# Patient Record
Sex: Female | Born: 1955
Health system: Southern US, Community
[De-identification: ages and names within clinical notes are randomized; demographics above are authoritative.]

---

## 1999-07-23 ENCOUNTER — Encounter: Admission: RE | Admit: 1999-07-23 | Discharge: 1999-07-23 | Payer: Self-pay | Admitting: Emergency Medicine

## 1999-07-23 ENCOUNTER — Encounter: Payer: Self-pay | Admitting: Emergency Medicine

## 1999-12-09 ENCOUNTER — Encounter: Payer: Self-pay | Admitting: Emergency Medicine

## 1999-12-09 ENCOUNTER — Emergency Department (HOSPITAL_COMMUNITY): Admission: EM | Admit: 1999-12-09 | Discharge: 1999-12-09 | Payer: Self-pay | Admitting: Emergency Medicine

## 2001-12-06 ENCOUNTER — Encounter: Payer: Self-pay | Admitting: Emergency Medicine

## 2001-12-06 ENCOUNTER — Encounter: Admission: RE | Admit: 2001-12-06 | Discharge: 2001-12-06 | Payer: Self-pay | Admitting: Emergency Medicine

## 2001-12-08 ENCOUNTER — Encounter: Admission: RE | Admit: 2001-12-08 | Discharge: 2001-12-08 | Payer: Self-pay | Admitting: Emergency Medicine

## 2001-12-08 ENCOUNTER — Encounter: Payer: Self-pay | Admitting: Emergency Medicine

## 2002-06-07 ENCOUNTER — Encounter (INDEPENDENT_AMBULATORY_CARE_PROVIDER_SITE_OTHER): Payer: Self-pay

## 2002-06-08 ENCOUNTER — Inpatient Hospital Stay (HOSPITAL_COMMUNITY): Admission: RE | Admit: 2002-06-08 | Discharge: 2002-06-09 | Payer: Self-pay | Admitting: Obstetrics and Gynecology

## 2004-03-04 ENCOUNTER — Emergency Department (HOSPITAL_COMMUNITY): Admission: EM | Admit: 2004-03-04 | Discharge: 2004-03-04 | Payer: Self-pay | Admitting: Family Medicine

## 2004-03-20 ENCOUNTER — Encounter: Admission: RE | Admit: 2004-03-20 | Discharge: 2004-03-20 | Payer: Self-pay | Admitting: Family Medicine

## 2004-05-06 ENCOUNTER — Encounter: Admission: RE | Admit: 2004-05-06 | Discharge: 2004-05-06 | Payer: Self-pay | Admitting: Family Medicine

## 2008-05-01 ENCOUNTER — Encounter: Admission: RE | Admit: 2008-05-01 | Discharge: 2008-05-01 | Payer: Self-pay | Admitting: Family Medicine

## 2010-04-25 IMAGING — MG MM DIAGNOSTIC BILATERAL
3 series · 3 of 3 positions shown · non-contrast
Comparison: 03/20/2004

CLINICAL DATA: The patient's physician feels that a lump in the 6
o'clock position of the right breast has increased in size.  The
patient does not feel a definite lump.

DIGITAL DIAGNOSTIC  BILATERAL  MAMMOGRAM  WITH CAD AND RIGHT BREAST
ULTRASOUND:

[L CC]
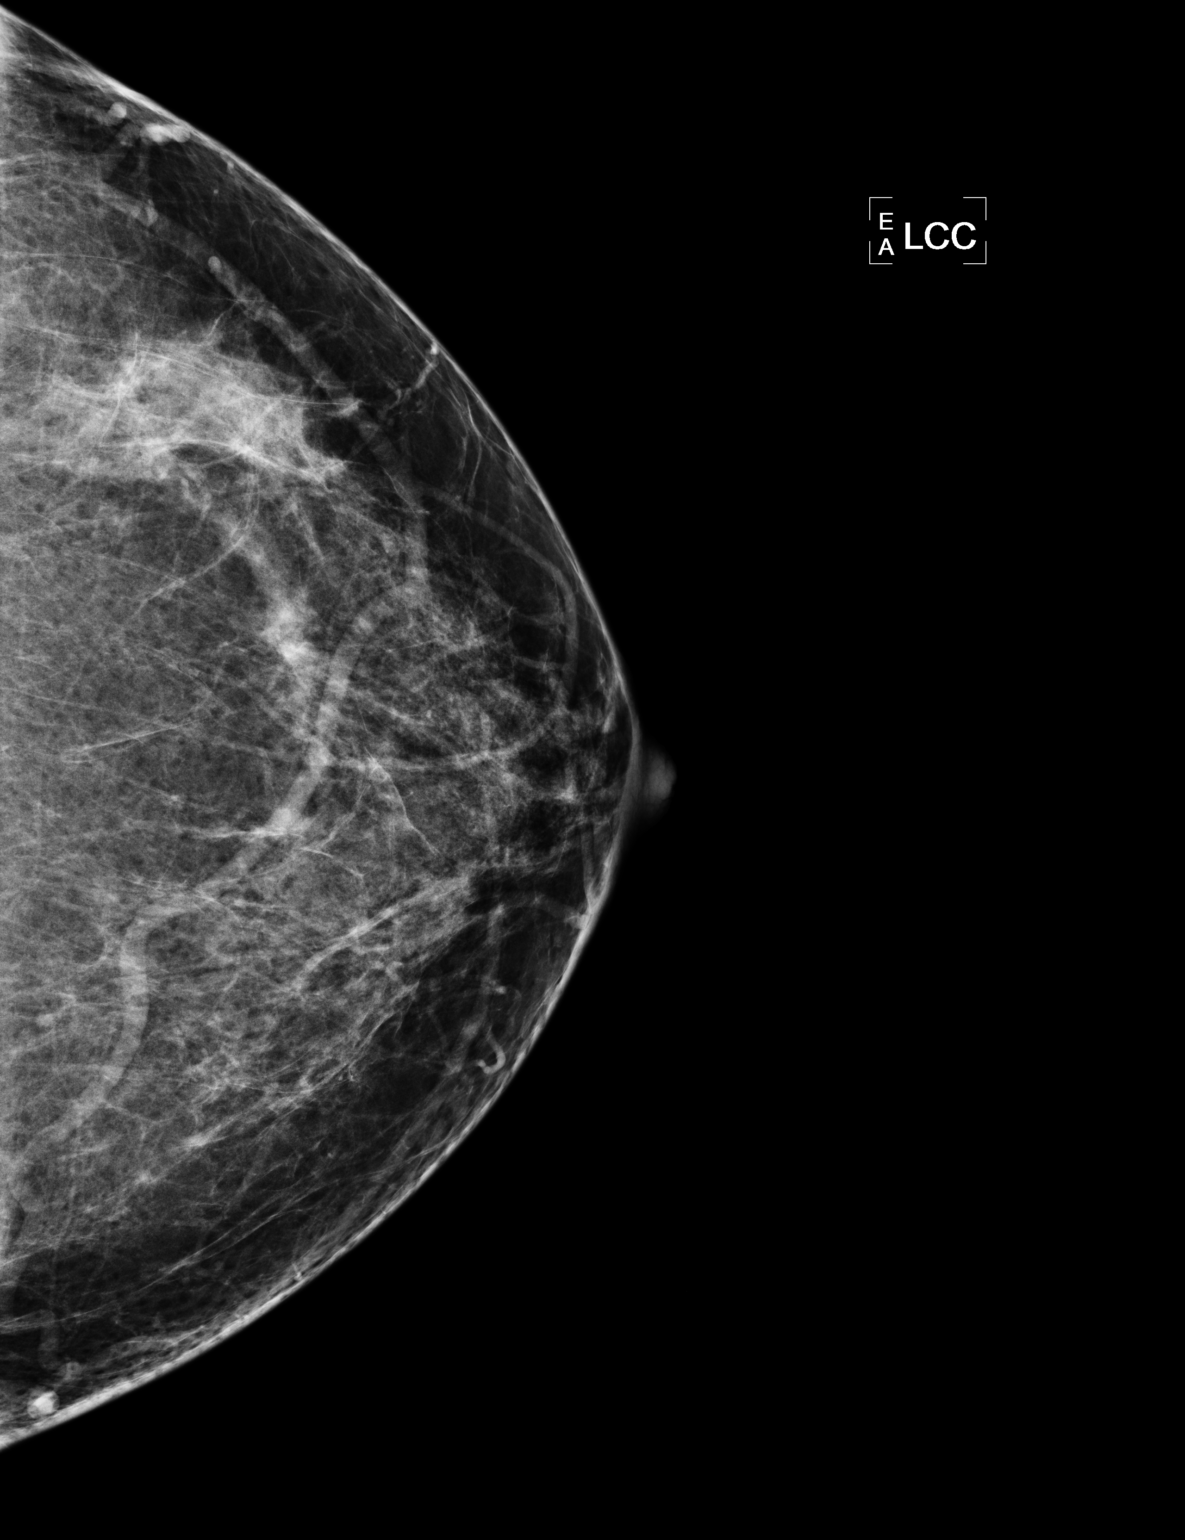

[L MLO]
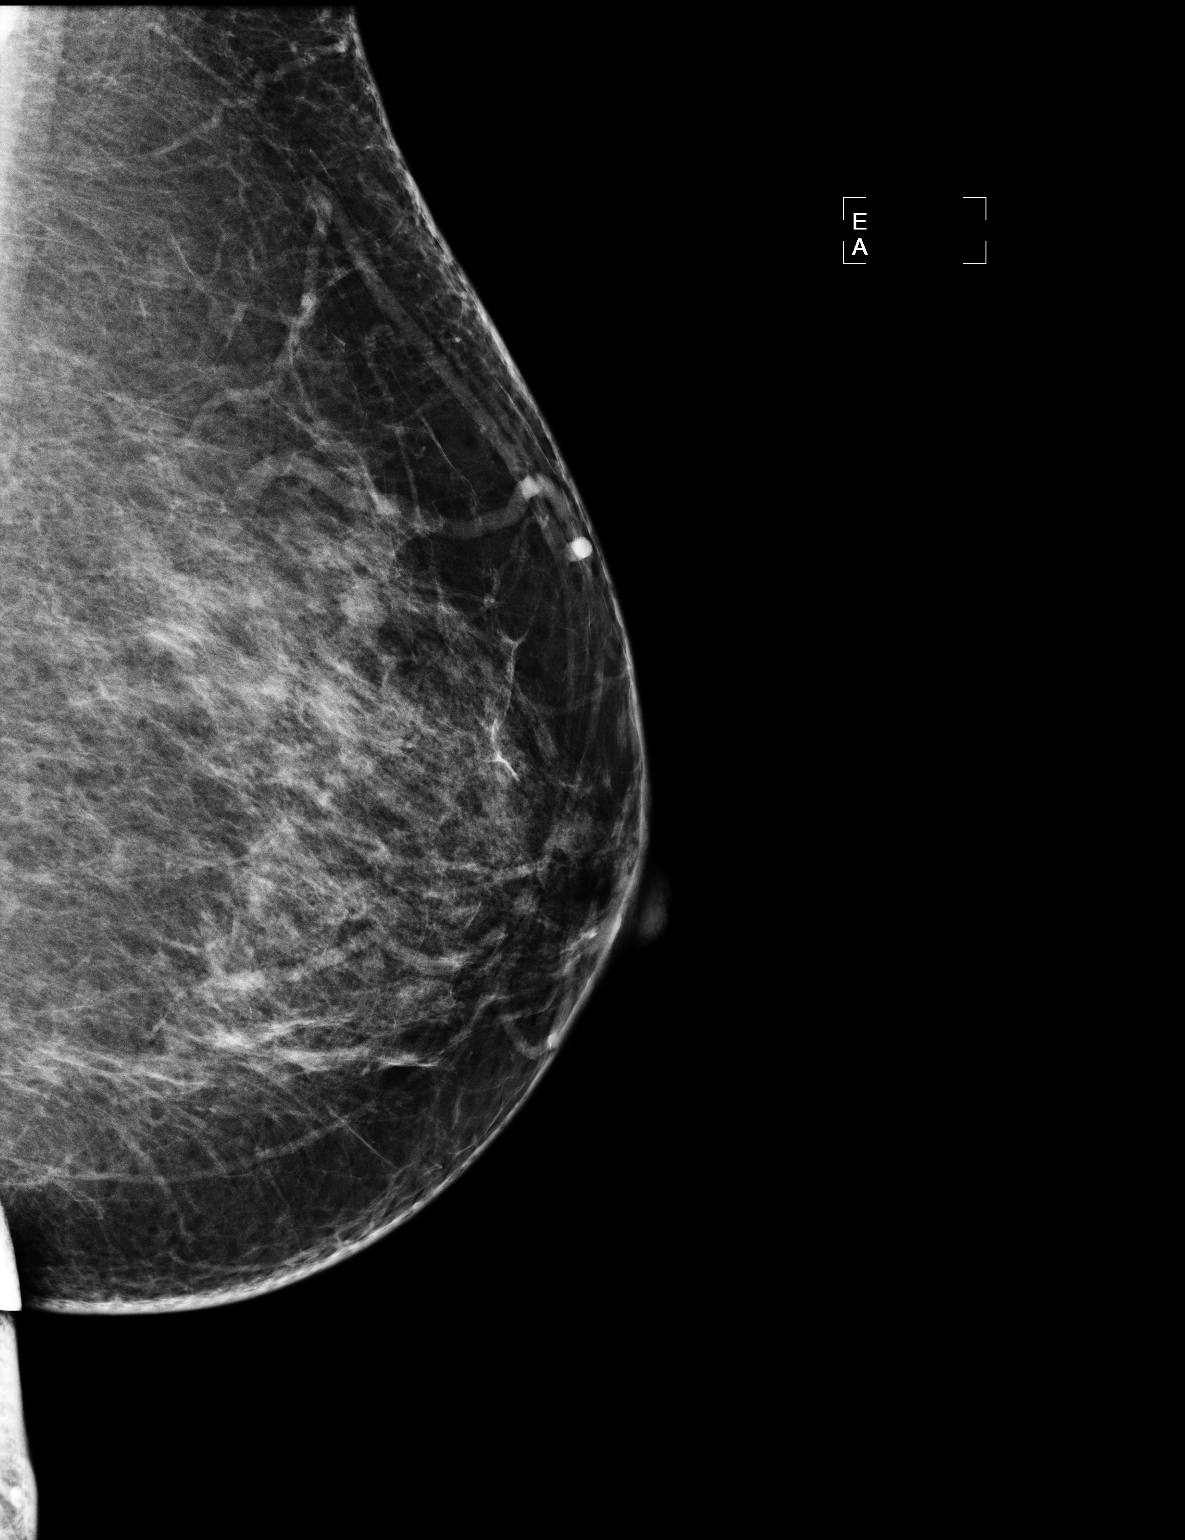

[R MLO]
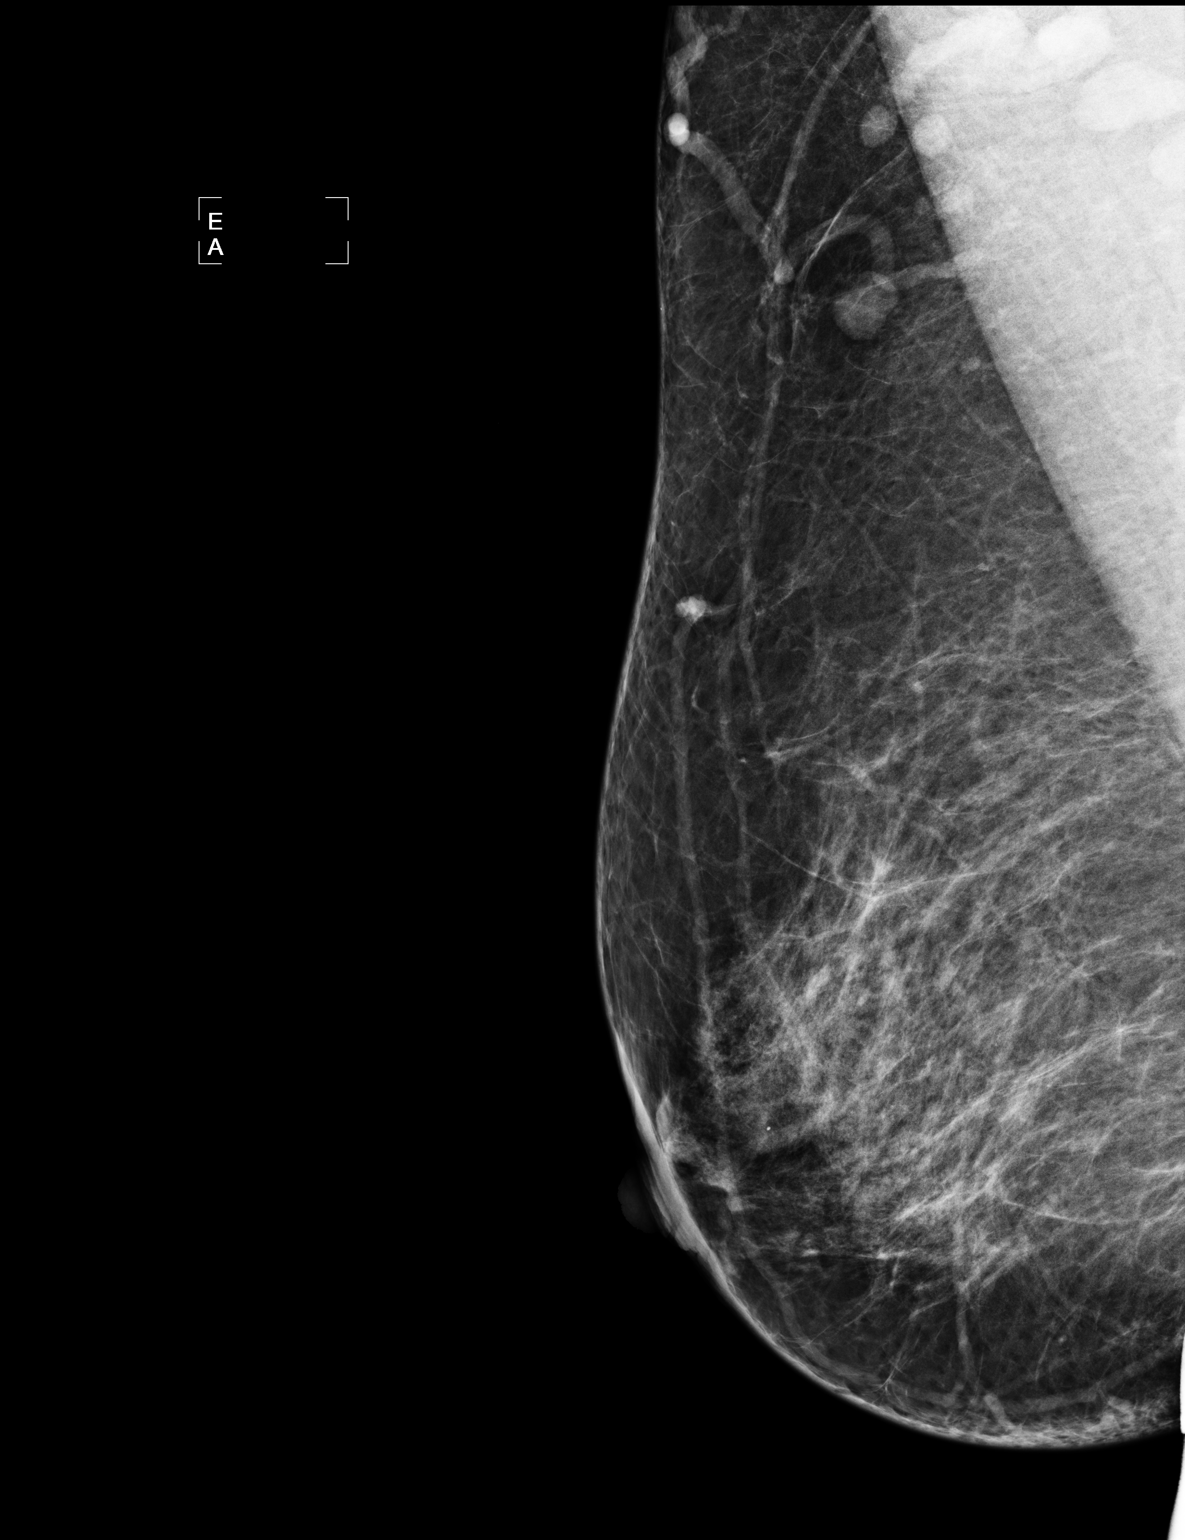

[3 of 3 positions shown; findings below may reference images not displayed]

FINDINGS: There are scattered fibroglandular densities.  There is
no suspicious dominant mass, architectural distortion or
calcification to suggest malignancy. The parenchymal pattern is
stable as compared to prior study.

On physical exam, I palpate soft thickening at 6 o'clock, 3 cm from
the right nipple.

Ultrasound is performed, showing a fatty lobule in this location.
There is no mass, distortion or shadowing to suggest malignancy.
IMPRESSION: No mammographic or sonographic evidence of malignancy.  Yearly
screening mammography is suggested.

BI-RADS CATEGORY 1:  Negative.

## 2010-06-05 NOTE — Op Note (Signed)
[name], [name]                          ACCOUNT NO.:  [id number]   MEDICAL RECORD NO.:  [id number]                   PATIENT TYPE:  OBV   LOCATION:  9311                                 FACILITY:  [facility]   PHYSICIAN:  Janine Limbo, M.D.            DATE OF BIRTH:  26-Aug-1955   DATE OF PROCEDURE:  06/07/2002  DATE OF DISCHARGE:                                 OPERATIVE REPORT   PREOPERATIVE DIAGNOSES:  1. A 14-week size fibroid uterus.  2. Menorrhagia.  3. Dysmenorrhea.  4. Anemia (hemoglobin is 10.6 preoperatively.   POSTOPERATIVE DIAGNOSES:  1. A 14-week size fibroid uterus.  2. Menorrhagia.  3. Dysmenorrhea.  4. Anemia (hemoglobin is 10.6 preoperatively.  5. Pelvic adhesions.   PROCEDURES:  1. Attempted vaginal hysterectomy.  2. Total abdominal hysterectomy.  3. Right salpingo-oophorectomy.  4. Lysis of adhesions.  5. Cystoscopy.  6. Placement of a Jackson-Pratt drain.   SURGEON:  Janine Limbo, M.D.   FIRST ASSISTANT:  Elmira J. Adline Peals.   ANESTHESIA:  General anesthesia.   DISPOSITION:  The patient is a 55 year old female, para 2-0-0-2, who  presents with the above mentioned diagnoses.  She understands the  indications for her procedure and she accepts the risks of, but not limited  to, anesthetic complications, bleeding, infections, and possible damage to  surrounding organs.  The patient understood the possibility that we may be  unable to complete her procedure vaginally because of the size of the  fibroids.  She understood that an abdominal hysterectomy may be required.   FINDINGS:  The postoperative uterus was 16-week size.  The uterine weight  was 640 g.  There were adhesions between the right adnexa and the right  posterior uterus.  There were adhesions between the left fallopian tube and  the left ovary and the left pelvic sidewall.   DESCRIPTION OF PROCEDURE:  The patient was taken to the operating room where  a general anesthetic  was given.  The patient's abdomen, perineum, and vagina  were prepped with multiple layers of Betadine.  A Foley catheter was placed  in the bladder.  The patient was sterilely draped.  Examination under  anesthesia was performed.  The cervix was injected with a diluted solution  of Pitressin and saline.  A circumferential incision was made around the  cervix and the vaginal mucosa was advanced anteriorly and posteriorly.  We  were unable to enter the anterior cul-de-sac.  We did enter the posterior  cul-de-sac sharply.  The vaginal cuff was then sutured, using a running  suture and a locking suture of 0 Vicryl.  Alternating from right to left,  the uterosacral ligaments, paracervical tissues, and parametrial tissues of  the lower uterine segment were clamped, cut, sutured, and tied securely.  We  then began morcellating the uterus because we were unable to invert the  uterus through the posterior colpotomy.  We also  began to core the inner  uterine tissue, keeping all dissected tissue in visualization.  Brisk  bleeding was encountered.  We were still unable to invert the uterus through  the posterior colpotomy.  We again tried to enter the anterior cul-de-sac  but we were unsuccessful.  Further attempts were made to invert the uterus  through the posterior colpotomy but once the estimated blood loss became  approximately 850 mL, the decision was made to abandon any further attempts  and to proceed with abdominal hysterectomy.   The patient was placed in a supine position.  The abdomen was prepped with  multiple layers Betadine and then was again draped.  The operators then  changed their gowns and gloves.  A low transverse incision was made in the  abdomen and that incision was extended sharply through the subcutaneous  tissue, the fascia, and the anterior peritoneum.  An abdominal wall  retractor was placed.  The bowel was packed cephalad.  The uterus was  elevated into the operative  field.  The upper pedicles were secured.  The  infundibulopelvic ligament on the right was noted to be bleeding and the  decision was made to remove the right tube and ovary.  The ureter was  identified and the infundibulopelvic ligament on the right was isolated.  The ligament was clamped and cut.  The ligament was then free tied and then  suture ligated.  The anterior cul-de-sac was then identified and the bladder  flap was developed anteriorly.  The bladder was pushed from the lower  uterine segment.  We completely isolated the upper pedicles and then the  uterine arteries.  The uterus was removed from our operative field.  Hemostasis was achieved using free ties.  The pelvis was irrigated and  hemostasis was noted to be adequate at this point.  The left ureter was then  identified and was felt to be without harm.  The vaginal cuff was closed  using a running locking suture.  Hemostasis was adequate.  The uterosacral  ligaments were tied together in the midline.  The pelvis was then  reperitonealized.  The pelvis was again irrigated and at this point,  hemostasis was noted to be adequate throughout.  All instruments were  removed.  The abdominal musculature, the fascia, and the subcutaneous layer  were irrigated.  The fascia was then closed by using two running sutures  from the corners to the midline, followed by three interrupted sutures in  the fascia.  A Jackson-Pratt drain was placed in the subcutaneous layer and  brought out through the right lower quadrant.  The drain was sutured into  place using 3-0 silk.  The subcutaneous layer was closed using interrupted  sutures.  The skin was reapproximated using skin staples.  The 0 Vicryl was  the material used except for otherwise mentioned.  Sponge, needle and  instrument counts were correct on two occasions.  The estimated blood loss  was 1100 mL.  The patient was noted to drain clear, yellow urine at this point.  The patient was given  an ampule of indigo carmine dye.  The  cystoscope was inserted into the bladder after the Foley catheter was  removed.  Blue dye was noted to pass through both ureteral orifices.  The  mucosa of the bladder was inspected and there was no evidence of damage and  there was no evidence of suture material.  The cystoscope was removed and  the Foley catheter was reinserted.  The  patient was awakened from her  anesthetic without difficulty and then taken to the recovery room in stable  condition.                                               Janine Limbo, M.D.   AVS/MEDQ  D:  06/07/2002  T:  06/07/2002  Job:  [id number]   cc:   Reuben Likes, M.D.  [address]  [place]  [place] [address]  Fax: [phone]

## 2010-06-05 NOTE — Discharge Summary (Signed)
[name], [name]                          ACCOUNT NO.:  [id number]   MEDICAL RECORD NO.:  [id number]                   PATIENT TYPE:  INP   LOCATION:  9311                                 FACILITY:  [facility]   PHYSICIAN:  Janine Limbo, M.D.            DATE OF BIRTH:  [birth year]   DATE OF ADMISSION:  06/07/2002  DATE OF DISCHARGE:  06/09/2002                                 DISCHARGE SUMMARY   DISCHARGE DIAGNOSES:  1. Fibroid uterus.  2. Pelvic adhesions.  3. Menorrhagia.  4. Dysmenorrhea.  5. Anemia (hemoglobin 10.6).   OPERATION:  On the date of admission the patient underwent an attempted  vaginal hysterectomy, which was aborted and then received a total abdominal  hysterectomy with lysis of adhesions, a right salpingo-oophorectomy,  cystoscopy and the placement of a JP drain.  The patient tolerated all  procedures well.  The patient was found to have a multiple fibroid uterus  (weighing 640 g),  pelvic adhesions, normal appearing tubes and ovaries  bilaterally (right tube and ovary were adherent), and patent ureters  bilaterally.   HISTORY OF PRESENT ILLNESS:  The patient is a 55 year old female, para 2-0-0-  2, who presents for vaginal hysterectomy because of fibroids, menorrhagia,  dysmenorrhea and severe anemia.  Please see the patient's dictated history  and physical examination for details.   PREOPERATIVE PHYSICAL EXAMINATION:  VITAL SIGNS:  Weight 184 pounds.  GENERAL:  Within normal limits.  PELVIC:  External genitalia is normal.  Vagina is normal.  Cervix was  nontender.  Uterus is 14 weeks size to 16 week size; irregular, firm and  nontender.  Adnexa without masses that were appreciated.  Rectovaginal exam  confirmed.   HOSPITAL COURSE:  On the date of admission the patient underwent  aforementioned procedures, tolerating them well.  Postoperative course was  unremarkable, with the patient resuming bowel and bladder function by  postoperative day #2;  therefore, being ready for discharge home.   DISCHARGE LABS:  Postoperative hemoglobin 6.8 (preoperative hemoglobin  10.6), and though patient was offered blood transfusion she declined due to  her lack of symptoms from her anemia.   DISCHARGE MEDICATIONS:  1. Vicodin 1-2 tablets q.4-6h. p.r.n. pain.  2. Ibuprofen 600 mg one tablet with food q.6h. for five days, then as needed     for pain.  3. Iron 325 mg one tablet b.i.d. for six weeks.  4. Phenergan 25 mg one tablet q.6h. p.r.n. for nausea.  5. Colace 100 mg one tablet b.i.d. until bowel movements are regular.   FOLLOW UP:  The patient is scheduled on Jun 11, 2002 at 2:30 p.m. for staple  removal at [facility] office.  She has a six week postoperative  exam with Dr. Stefano Gaul on July 31, 2002 at 2:30 p.m.   DISCHARGE INSTRUCTIONS:  The patient was given a copy of [facility] postoperative instruction sheet.  She was further advised to avoid  driving for two weeks, heavy lifting for four weeks and no intercourse for  six weeks.   FINAL PATHOLOGY:  Uterus and cervix:  The cervix with prominent nabothian  cyst.  Benign secretory type endometrium.  Leiomyomata uteri 640 g uterus.  Ovary with fibrous adhesions and fallopian tube with no pathologic  abnormality identified.     [name].                    Janine Limbo, M.D.    EJP/MEDQ  D:  07/31/2002  T:  07/31/2002  Job:  [id number]

## 2010-06-05 NOTE — H&P (Signed)
[name], [name]                          ACCOUNT NO.:  [id number]   MEDICAL RECORD NO.:  [id number]                   PATIENT TYPE:  AMB   LOCATION:  SDC                                  FACILITY:  [facility]   PHYSICIAN:  Janine Limbo, M.D.            DATE OF BIRTH:  [birth year]   DATE OF ADMISSION:  06/07/2002  DATE OF DISCHARGE:                                HISTORY & PHYSICAL   HISTORY OF PRESENT ILLNESS:  The patient is a 55 year old female para 2-0-0-  2 who presents for a vaginal hysterectomy because of fibroids, menorrhagia,  dysmenorrhea, and severe anemia.  An ultrasound was performed that showed a  14.4 cm uterus with multiple fibroids.  The largest fibroid measured 5.6 cm.  The patient had an endometrial biopsy performed that showed benign elements.  Her hemoglobin was noted to be 8.2.  She does take iron.  Hormonal therapy  and nonsteroidal antiinflammatory agents have not relieved her discomfort.  Her most recent Pap smear was within normal limits.  The patient has had a  prior tubal ligation.  She denies a history of sexually-transmitted  infections.   PAST MEDICAL HISTORY:  History of anemia as mentioned above.  The patient  had an appendectomy performed at age 51.   MEDICATIONS:  She currently takes iron, calcium, and oral contraceptives.   OBSTETRICAL HISTORY:  The patient has had two term vaginal deliveries.   DRUG ALLERGIES:  None known.   SOCIAL HISTORY:  The patient works as a Engineer, civil (consulting).  She drinks alcohol socially.  She denies cigarette use and recreational drug use.   REVIEW OF SYSTEMS:  The patient has had the usual childhood diseases.  She  wears reading glasses.  She exercises occasionally and her diet is regular.   FAMILY HISTORY:  The patient's mother has hypertension, diabetes, and her  mother has had a stroke.  Her father has heart disease.   PHYSICAL EXAMINATION:  VITAL SIGNS:  Weight 184 pounds.  HEENT:  Within normal limits.  CHEST:   Clear.  HEART:  Regular rate and rhythm.  BREASTS:  Without masses.  ABDOMEN:  Nontender.  EXTREMITIES:  Within normal limits.  NEUROLOGIC:  Grossly normal.  PELVIC:  External genitalia is normal.  The vagina is normal.  Cervix is  nontender.  The uterus is 14-weeks size to 16-weeks size, irregular, firm,  and nontender.  Adnexa with no masses appreciated.  Rectovaginal exam  confirms.   ASSESSMENT:  1. A 14- to 16-week-size fibroid uterus.  2. Menorrhagia.  3. Dysmenorrhea.  4. Anemia (hemoglobin 8.2).   PROCEDURE:  The patient will undergo a vaginal hysterectomy.  She  understands the indications for her procedure and she accepts the risks of,  but not limited to, anesthetic complications, bleeding, infections, and the  possible damage to the surrounding organs.  Janine Limbo, M.D.    AVS/MEDQ  D:  05/31/2002  T:  05/31/2002  Job:  [id number]   cc:   Reuben Likes, M.D.  [address]  [place]  [place] [address]  Fax: [phone]

## 2011-05-13 ENCOUNTER — Other Ambulatory Visit: Payer: Self-pay | Admitting: Family Medicine

## 2011-05-13 DIAGNOSIS — Z1231 Encounter for screening mammogram for malignant neoplasm of breast: Secondary | ICD-10-CM

## 2011-05-24 ENCOUNTER — Ambulatory Visit
Admission: RE | Admit: 2011-05-24 | Discharge: 2011-05-24 | Disposition: A | Discharge status: Home / Self Care | Payer: PRIVATE HEALTH INSURANCE | Source: Ambulatory Visit | Attending: Family Medicine | Admitting: Family Medicine

## 2011-05-24 DIAGNOSIS — Z1231 Encounter for screening mammogram for malignant neoplasm of breast: Secondary | ICD-10-CM

## 2014-03-29 ENCOUNTER — Other Ambulatory Visit: Payer: Self-pay

## 2014-03-29 DIAGNOSIS — Z1231 Encounter for screening mammogram for malignant neoplasm of breast: Secondary | ICD-10-CM

## 2014-04-08 ENCOUNTER — Ambulatory Visit
Admission: RE | Admit: 2014-04-08 | Discharge: 2014-04-08 | Disposition: A | Discharge status: Home / Self Care | Payer: Managed Care, Other (non HMO) | Source: Ambulatory Visit

## 2014-04-08 DIAGNOSIS — Z1231 Encounter for screening mammogram for malignant neoplasm of breast: Secondary | ICD-10-CM

## 2014-10-29 ENCOUNTER — Ambulatory Visit
Admission: RE | Admit: 2014-10-29 | Discharge: 2014-10-29 | Disposition: A | Discharge status: Home / Self Care | Payer: Managed Care, Other (non HMO) | Source: Ambulatory Visit | Attending: Physician Assistant | Admitting: Physician Assistant

## 2014-10-29 ENCOUNTER — Other Ambulatory Visit: Payer: Self-pay | Admitting: Physician Assistant

## 2014-10-29 DIAGNOSIS — R52 Pain, unspecified: Secondary | ICD-10-CM

## 2015-06-24 ENCOUNTER — Other Ambulatory Visit: Payer: Self-pay | Admitting: Family Medicine

## 2015-06-24 DIAGNOSIS — Z1231 Encounter for screening mammogram for malignant neoplasm of breast: Secondary | ICD-10-CM

## 2015-07-02 ENCOUNTER — Ambulatory Visit
Admission: RE | Admit: 2015-07-02 | Discharge: 2015-07-02 | Disposition: A | Discharge status: Home / Self Care | Payer: Managed Care, Other (non HMO) | Source: Ambulatory Visit | Attending: Family Medicine | Admitting: Family Medicine

## 2015-07-02 DIAGNOSIS — Z1231 Encounter for screening mammogram for malignant neoplasm of breast: Secondary | ICD-10-CM

## 2015-07-15 ENCOUNTER — Other Ambulatory Visit: Payer: Self-pay | Admitting: Family Medicine

## 2015-07-15 ENCOUNTER — Ambulatory Visit
Admission: RE | Admit: 2015-07-15 | Discharge: 2015-07-15 | Disposition: A | Discharge status: Home / Self Care | Payer: Managed Care, Other (non HMO) | Source: Ambulatory Visit | Attending: Family Medicine | Admitting: Family Medicine

## 2015-07-15 DIAGNOSIS — R52 Pain, unspecified: Secondary | ICD-10-CM

## 2016-05-17 ENCOUNTER — Other Ambulatory Visit: Payer: Self-pay | Admitting: Family Medicine

## 2016-05-17 DIAGNOSIS — Z1231 Encounter for screening mammogram for malignant neoplasm of breast: Secondary | ICD-10-CM

## 2016-07-06 ENCOUNTER — Ambulatory Visit
Admission: RE | Admit: 2016-07-06 | Discharge: 2016-07-06 | Disposition: A | Discharge status: Home / Self Care | Payer: BLUE CROSS/BLUE SHIELD | Source: Ambulatory Visit | Attending: Family Medicine | Admitting: Family Medicine

## 2016-07-06 DIAGNOSIS — Z1231 Encounter for screening mammogram for malignant neoplasm of breast: Secondary | ICD-10-CM

## 2016-07-20 DIAGNOSIS — Z0000 Encounter for general adult medical examination without abnormal findings: Secondary | ICD-10-CM | POA: Diagnosis not present

## 2016-07-20 DIAGNOSIS — E7800 Pure hypercholesterolemia, unspecified: Secondary | ICD-10-CM | POA: Diagnosis not present

## 2016-07-20 DIAGNOSIS — D649 Anemia, unspecified: Secondary | ICD-10-CM | POA: Diagnosis not present

## 2016-12-21 DIAGNOSIS — Z1211 Encounter for screening for malignant neoplasm of colon: Secondary | ICD-10-CM | POA: Diagnosis not present

## 2017-09-20 ENCOUNTER — Other Ambulatory Visit: Payer: Self-pay | Admitting: Family Medicine

## 2017-09-20 DIAGNOSIS — Z1231 Encounter for screening mammogram for malignant neoplasm of breast: Secondary | ICD-10-CM

## 2017-10-20 ENCOUNTER — Ambulatory Visit
Admission: RE | Admit: 2017-10-20 | Discharge: 2017-10-20 | Disposition: A | Discharge status: Home / Self Care | Payer: BLUE CROSS/BLUE SHIELD | Source: Ambulatory Visit | Attending: Family Medicine | Admitting: Family Medicine

## 2017-10-20 DIAGNOSIS — Z1231 Encounter for screening mammogram for malignant neoplasm of breast: Secondary | ICD-10-CM | POA: Diagnosis not present

## 2017-10-27 DIAGNOSIS — E7800 Pure hypercholesterolemia, unspecified: Secondary | ICD-10-CM | POA: Diagnosis not present

## 2017-10-27 DIAGNOSIS — Z1159 Encounter for screening for other viral diseases: Secondary | ICD-10-CM | POA: Diagnosis not present

## 2017-10-27 DIAGNOSIS — D649 Anemia, unspecified: Secondary | ICD-10-CM | POA: Diagnosis not present

## 2017-10-27 DIAGNOSIS — Z0000 Encounter for general adult medical examination without abnormal findings: Secondary | ICD-10-CM | POA: Diagnosis not present

## 2018-07-07 ENCOUNTER — Encounter (HOSPITAL_COMMUNITY): Payer: Self-pay | Admitting: Emergency Medicine

## 2018-07-07 ENCOUNTER — Ambulatory Visit (HOSPITAL_COMMUNITY)
Admission: EM | Admit: 2018-07-07 | Discharge: 2018-07-07 | Disposition: A | Discharge status: Home / Self Care | Payer: BC Managed Care – PPO | Attending: Internal Medicine | Admitting: Internal Medicine

## 2018-07-07 DIAGNOSIS — L723 Sebaceous cyst: Secondary | ICD-10-CM

## 2018-07-07 DIAGNOSIS — L089 Local infection of the skin and subcutaneous tissue, unspecified: Secondary | ICD-10-CM | POA: Diagnosis not present

## 2018-07-07 MED ORDER — DOXYCYCLINE HYCLATE 100 MG PO CAPS: 100.0000 mg | ORAL_CAPSULE | Freq: Two times a day (BID) | ORAL | 0 refills | Status: AC

## 2018-07-07 NOTE — ED Provider Notes (Addendum)
[facility]    CSN: [id number] Arrival date & time: 07/07/18  0840     History   Chief Complaint Chief Complaint  Patient presents with  . Abscess    HPI Dustin FolksRhonda H Reyes is a 63 y.o. female with a history of sebaceous cyst comes to urgent care with painful swelling in the right neck area which started several days ago.  No antecedent trauma.   Patient denies fever or chills.  Pain is throbbing, constant and nonradiating.  Aggravated by palpation.  No known relieving factors.  Patient has not tried any over-the-counter medications.  Patient had incision and drainage of the sebaceous cyst in the past.  HPI  History reviewed. No pertinent past medical history.  There are no active problems to display for this patient.   History reviewed. No pertinent surgical history.  OB History   No obstetric history on file.      Home Medications    Prior to Admission medications   Medication Sig Start Date End Date Taking? Authorizing Provider  doxycycline (VIBRAMYCIN) 100 MG capsule Take 1 capsule (100 mg total) by mouth 2 (two) times daily for 7 days. 07/07/18 07/14/18  LampteyBritta Mccreedy, Philip O, MD    Family History Family History  Family history unknown: Yes    Social History Social History   Tobacco Use  . Smoking status: Never Smoker  Substance Use Topics  . Alcohol use: Yes  . Drug use: Never     Allergies   Patient has no known allergies.   Review of Systems Review of Systems  Constitutional: Negative for activity change and appetite change.  HENT: Negative.   Respiratory: Negative.   Cardiovascular: Negative.   Gastrointestinal: Negative.   Genitourinary: Negative.   Musculoskeletal: Negative.   Skin: Positive for wound. Negative for color change and pallor.  Neurological: Negative for dizziness, speech difficulty, weakness, light-headedness and numbness.  Psychiatric/Behavioral: Negative.   All other systems reviewed and are negative.    Physical  Exam Triage Vital Signs ED Triage Vitals  Enc Vitals Group     BP 07/07/18 0907 131/72     Pulse Rate 07/07/18 0907 60     Resp 07/07/18 0907 16     Temp 07/07/18 0907 98.4 F (36.9 C)     Temp Source 07/07/18 0907 Oral     SpO2 07/07/18 0907 100 %     Weight --      Height --      Head Circumference --      Peak Flow --      Pain Score 07/07/18 0909 0     Pain Loc --      Pain Edu? --      Excl. in GC? --    No data found.  Updated Vital Signs BP 131/72 (BP Location: Left Arm)   Pulse 60   Temp 98.4 F (36.9 C) (Oral)   Resp 16   SpO2 100%   Visual Acuity Right Eye Distance:   Left Eye Distance:   Bilateral Distance:    Right Eye Near:   Left Eye Near:    Bilateral Near:     Physical Exam Constitutional:      General: She is in acute distress.     Appearance: Normal appearance. She is not ill-appearing or toxic-appearing.  HENT:     Nose: Nose normal.     Mouth/Throat:     Mouth: Mucous membranes are moist.     Pharynx: No oropharyngeal  exudate or posterior oropharyngeal erythema.  Eyes:     Extraocular Movements: Extraocular movements intact.     Conjunctiva/sclera: Conjunctivae normal.  Neck:     Musculoskeletal: Normal range of motion.  Cardiovascular:     Rate and Rhythm: Normal rate and regular rhythm.     Pulses: Normal pulses.     Heart sounds: Normal heart sounds.  Pulmonary:     Effort: Pulmonary effort is normal.     Breath sounds: Normal breath sounds.  Abdominal:     General: Bowel sounds are normal.     Palpations: Abdomen is soft.  Musculoskeletal: Normal range of motion.  Skin:    General: Skin is warm.     Capillary Refill: Capillary refill takes less than 2 seconds.     Comments: Abscess on the right side of the neck.  Tender to touch, fluctuant and measures about 2 inches in the longest diameter.  Abscess looks mature for incision and drainage   Neurological:     General: No focal deficit present.     Mental Status: She is  alert and oriented to person, place, and time.      UC Treatments / Results  Labs (all labs ordered are listed, but only abnormal results are displayed) Labs Reviewed - No data to display  EKG None  Radiology No results found.  Procedures Incision and Drainage  Date/Time: 07/08/2018 10:37 AM Performed by: Chase Picket, MD Authorized by: Chase Picket, MD   Consent:    Consent obtained:  Verbal   Consent given by:  Patient   Risks discussed:  Bleeding, incomplete drainage and infection   Alternatives discussed:  No treatment and observation Location:    Type:  Abscess   Location:  Neck   Neck location:  R anterior Pre-procedure details:    Skin preparation:  Betadine Anesthesia (see MAR for exact dosages):    Anesthesia method:  Local infiltration   Local anesthetic:  Lidocaine 2% WITH epi Procedure type:    Complexity:  Complex Procedure details:    Needle aspiration: no     Incision types:  Single straight   Incision depth:  Subcutaneous   Scalpel blade:  11   Wound management:  Probed and deloculated   Drainage:  Purulent   Drainage amount:  Moderate   Wound treatment:  Wound left open   Packing materials:  1/4 in gauze   Amount 1/4":  2 inches Post-procedure details:    Patient tolerance of procedure:  Tolerated well, no immediate complications   (including critical care time)  Medications Ordered in UC Medications - No data to display  Initial Impression / Assessment and Plan / UC Course  I have reviewed the triage vital signs and the nursing notes.  Pertinent labs & imaging results that were available during my care of the patient were reviewed by me and considered in my medical decision making (see chart for details).     1.  Infected sebaceous cyst on the right side of neck: Incision and drainage  Doxycycline 100 mg orally twice daily for 7 days Tylenol/Motrin for pain and/or fever. Patient is advised to return to urgent care if she  notices increasing redness, increasing discharge, fever or chills.  Discharge instructions on care for incision and drainage was given to patient. Final Clinical Impressions(s) / UC Diagnoses   Final diagnoses:  Infected sebaceous cyst   Discharge Instructions   None    ED Prescriptions    Medication Sig  Dispense Auth. Provider   doxycycline (VIBRAMYCIN) 100 MG capsule Take 1 capsule (100 mg total) by mouth 2 (two) times daily for 7 days. 14 capsule Lamptey, Britta MccreedyPhilip O, MD     Controlled Substance Prescriptions [place] Controlled Substance Registry consulted? No   Merrilee JanskyLamptey, Philip O, MD 07/08/18 1036    Merrilee JanskyLamptey, Philip O, MD 07/08/18 [phone]

## 2018-07-07 NOTE — ED Triage Notes (Signed)
Pt states she has an abscess on the side of her neck, states shes had the same spot grow an abscess a few years ago and had it lanced, who lanced it said it was call a sebaceous cyst. Pt abscess has been there overnight.

## 2018-07-08 DIAGNOSIS — L723 Sebaceous cyst: Secondary | ICD-10-CM | POA: Diagnosis not present

## 2018-07-08 DIAGNOSIS — L089 Local infection of the skin and subcutaneous tissue, unspecified: Secondary | ICD-10-CM

## 2018-09-26 DIAGNOSIS — Z20828 Contact with and (suspected) exposure to other viral communicable diseases: Secondary | ICD-10-CM | POA: Diagnosis not present

## 2018-10-03 DIAGNOSIS — Z20828 Contact with and (suspected) exposure to other viral communicable diseases: Secondary | ICD-10-CM | POA: Diagnosis not present

## 2018-10-09 DIAGNOSIS — Z20828 Contact with and (suspected) exposure to other viral communicable diseases: Secondary | ICD-10-CM | POA: Diagnosis not present

## 2018-10-16 DIAGNOSIS — Z20828 Contact with and (suspected) exposure to other viral communicable diseases: Secondary | ICD-10-CM | POA: Diagnosis not present

## 2018-10-23 DIAGNOSIS — Z20828 Contact with and (suspected) exposure to other viral communicable diseases: Secondary | ICD-10-CM | POA: Diagnosis not present

## 2018-10-30 DIAGNOSIS — Z20828 Contact with and (suspected) exposure to other viral communicable diseases: Secondary | ICD-10-CM | POA: Diagnosis not present

## 2018-11-02 ENCOUNTER — Other Ambulatory Visit: Payer: Self-pay | Admitting: Family Medicine

## 2018-11-02 DIAGNOSIS — Z0000 Encounter for general adult medical examination without abnormal findings: Secondary | ICD-10-CM | POA: Diagnosis not present

## 2018-11-02 DIAGNOSIS — Z1231 Encounter for screening mammogram for malignant neoplasm of breast: Secondary | ICD-10-CM

## 2018-11-06 DIAGNOSIS — Z20828 Contact with and (suspected) exposure to other viral communicable diseases: Secondary | ICD-10-CM | POA: Diagnosis not present

## 2018-11-07 ENCOUNTER — Other Ambulatory Visit: Payer: Self-pay | Admitting: Family Medicine

## 2018-11-07 DIAGNOSIS — E7800 Pure hypercholesterolemia, unspecified: Secondary | ICD-10-CM | POA: Diagnosis not present

## 2018-11-07 DIAGNOSIS — L720 Epidermal cyst: Secondary | ICD-10-CM | POA: Diagnosis not present

## 2018-11-07 DIAGNOSIS — D649 Anemia, unspecified: Secondary | ICD-10-CM | POA: Diagnosis not present

## 2018-11-13 DIAGNOSIS — Z20828 Contact with and (suspected) exposure to other viral communicable diseases: Secondary | ICD-10-CM | POA: Diagnosis not present

## 2018-11-27 DIAGNOSIS — Z20828 Contact with and (suspected) exposure to other viral communicable diseases: Secondary | ICD-10-CM | POA: Diagnosis not present

## 2018-12-21 ENCOUNTER — Ambulatory Visit
Admission: RE | Admit: 2018-12-21 | Discharge: 2018-12-21 | Disposition: A | Discharge status: Home / Self Care | Payer: BC Managed Care – PPO | Source: Ambulatory Visit | Attending: Family Medicine | Admitting: Family Medicine

## 2018-12-21 ENCOUNTER — Other Ambulatory Visit: Payer: Self-pay

## 2018-12-21 DIAGNOSIS — Z1231 Encounter for screening mammogram for malignant neoplasm of breast: Secondary | ICD-10-CM | POA: Diagnosis not present

## 2019-04-26 DIAGNOSIS — M25531 Pain in right wrist: Secondary | ICD-10-CM | POA: Diagnosis not present

## 2019-06-05 ENCOUNTER — Encounter (HOSPITAL_COMMUNITY): Payer: Self-pay

## 2019-06-05 ENCOUNTER — Ambulatory Visit (HOSPITAL_COMMUNITY)
Admission: EM | Admit: 2019-06-05 | Discharge: 2019-06-05 | Disposition: A | Discharge status: Home / Self Care | Payer: BC Managed Care – PPO | Attending: Family Medicine | Admitting: Family Medicine

## 2019-06-05 ENCOUNTER — Other Ambulatory Visit: Payer: Self-pay

## 2019-06-05 DIAGNOSIS — M654 Radial styloid tenosynovitis [de Quervain]: Secondary | ICD-10-CM

## 2019-06-05 MED ORDER — PREDNISONE 10 MG (21) PO TBPK: ORAL_TABLET | Freq: Every day | ORAL | 0 refills | Status: DC

## 2019-06-05 NOTE — ED Provider Notes (Signed)
[facility]   [id number] 06/05/19 Arrival Time: 0910  ASSESSMENT & PLAN:  1. De Quervain's tenosynovitis, right     No indication for imaging at this time. Fitted with thumb spica splint to wear for the next 1-2 weeks.  Begin: Meds ordered this encounter  Medications  . predniSONE (STERAPRED UNI-PAK 21 TAB) 10 MG (21) TBPK tablet    Sig: Take by mouth daily. Take as directed.    Dispense:  21 tablet    Refill:  0    OTC ibuprofen preferred.   Recommend: Follow-up Information    [facility].   Why: If worsening or failing to improve as anticipated. Contact information: [address] [place] [address] [phone]           Reviewed expectations re: course of current medical issues. Questions answered. Outlined signs and symptoms indicating need for more acute intervention. Patient verbalized understanding. After Visit Summary given.  SUBJECTIVE: History from: patient. BRAEDYN RIGGLE is a 64 y.o. female who reports fairly persistent moderate pain of her right radial wrist; described as aching with occasional sharp pains; without radiation. Onset: gradual. First noted: "months ago". Injury/trama: no. Symptoms have gradually worsened since beginning. Aggravating factors: certain movements and grasping. Alleviating factors: rest. Associated symptoms: none reported. Extremity sensation changes or weakness: none. Self treatment: Icy Hot without relief.   History reviewed. No pertinent surgical history.    OBJECTIVE:  Vitals:   06/05/19 0949 06/05/19 0951  BP:  122/68  Pulse:  70  Resp:  18  Temp:  98.1 F (36.7 C)  TempSrc:  Oral  SpO2:  100%  Weight: 86.2 kg     General appearance: alert; no distress HEENT: [place]; AT Neck: supple with FROM Resp: unlabored respirations Extremities: . RUE: warm with well perfused appearance; pain reported over radial side of wrist, more notable with thumb  and wrist movement; no erythema or inflammation; no swelling; FROM; very tender over radial styloid CV: brisk extremity capillary refill of RUE; 2+ radial pulse of RUE. Skin: warm and dry; no visible rashes Neurologic: gait normal; normal sensation and strength of RUE Psychological: alert and cooperative; normal mood and affect    No Known Allergies  History reviewed. No pertinent past medical history.    Social History   Socioeconomic History  . Marital status: Married    Spouse name: Not on file  . Number of children: Not on file  . Years of education: Not on file  . Highest education level: Not on file  Occupational History  . Not on file  Tobacco Use  . Smoking status: Never Smoker  . Smokeless tobacco: Never Used  Substance and Sexual Activity  . Alcohol use: Yes  . Drug use: Never  . Sexual activity: Not Currently    Birth control/protection: Surgical  Other Topics Concern  . Not on file  Social History Narrative  . Not on file   Social Determinants of Health   Financial Resource Strain:   . Difficulty of Paying Living Expenses:   Food Insecurity:   . Worried About Programme researcher, broadcasting/film/video in the Last Year:   . Barista in the Last Year:   Transportation Needs:   . Freight forwarder (Medical):   Marland Kitchen Lack of Transportation (Non-Medical):   Physical Activity:   . Days of Exercise per Week:   . Minutes of Exercise per Session:   Stress:   . Feeling  of Stress :   Social Connections:   . Frequency of Communication with Friends and Family:   . Frequency of Social Gatherings with Friends and Family:   . Attends Religious Services:   . Active Member of Clubs or Organizations:   . Attends Archivist Meetings:   Marland Kitchen Marital Status:    Family History  Family history unknown: Yes   History reviewed. No pertinent surgical history.    Vanessa Kick, MD 06/06/19 [phone]

## 2019-06-05 NOTE — ED Triage Notes (Signed)
Pt is here with right wrist pain that started months ago, pt has seen her PCP & is currently waiting on for the Hand specialist to call with her refferal. Pt states she has iced it, different braces, Icy-Hot cream to relieve discomfort.

## 2019-06-26 DIAGNOSIS — M654 Radial styloid tenosynovitis [de Quervain]: Secondary | ICD-10-CM | POA: Diagnosis not present

## 2019-06-26 DIAGNOSIS — M79641 Pain in right hand: Secondary | ICD-10-CM | POA: Diagnosis not present

## 2019-10-14 IMAGING — MG DIGITAL SCREENING BILATERAL MAMMOGRAM WITH CAD
4 series · 4 of 4 positions shown · non-contrast
Comparison: Previous exam(s).

CLINICAL DATA: Screening.

EXAM:
DIGITAL SCREENING BILATERAL MAMMOGRAM WITH CAD

[L CC]
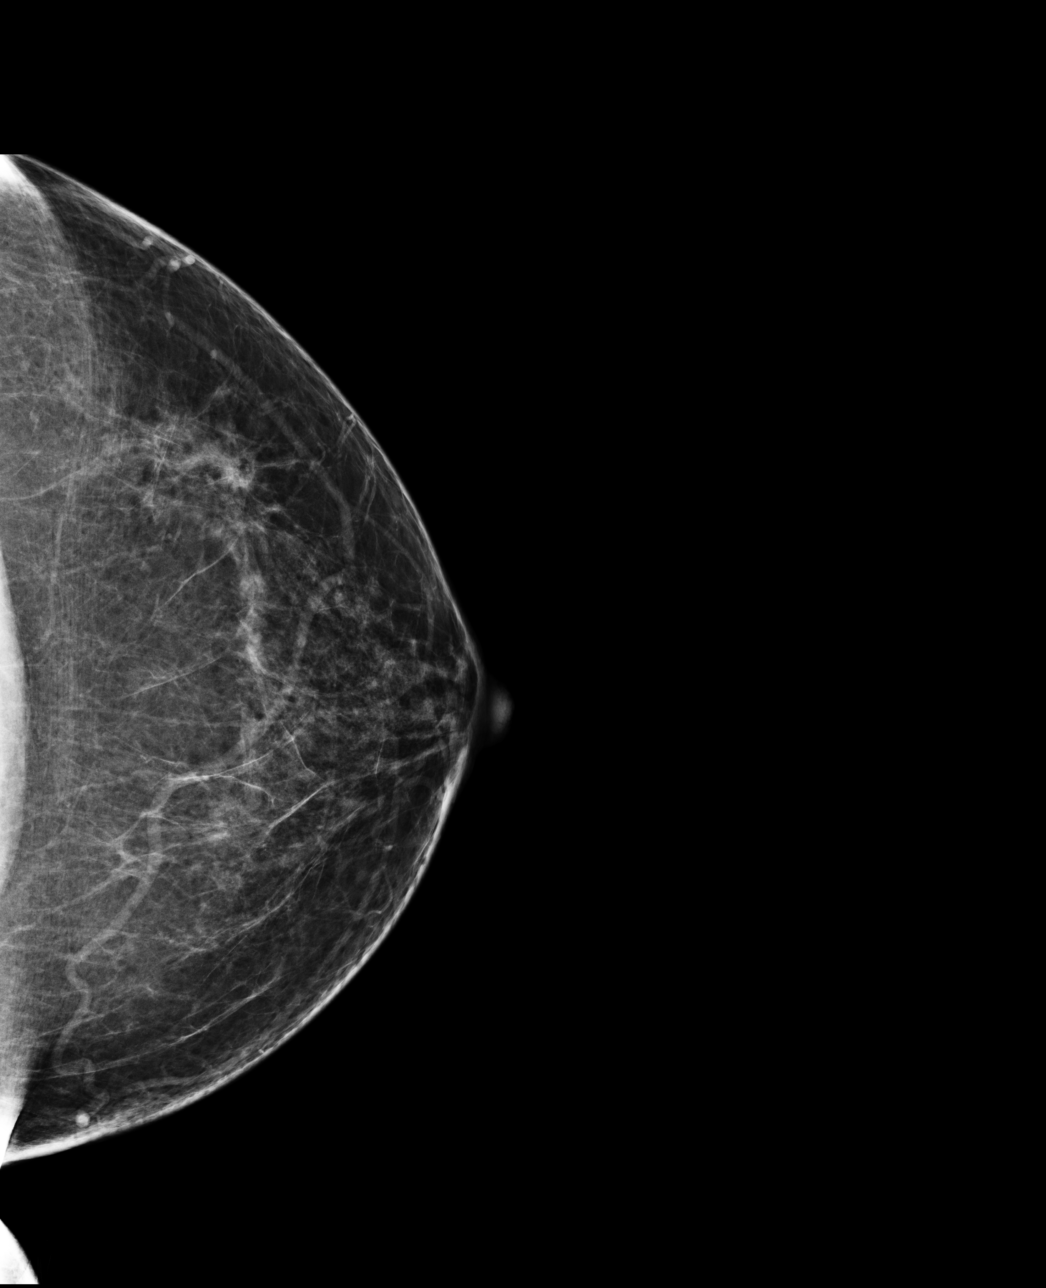

[L MLO]
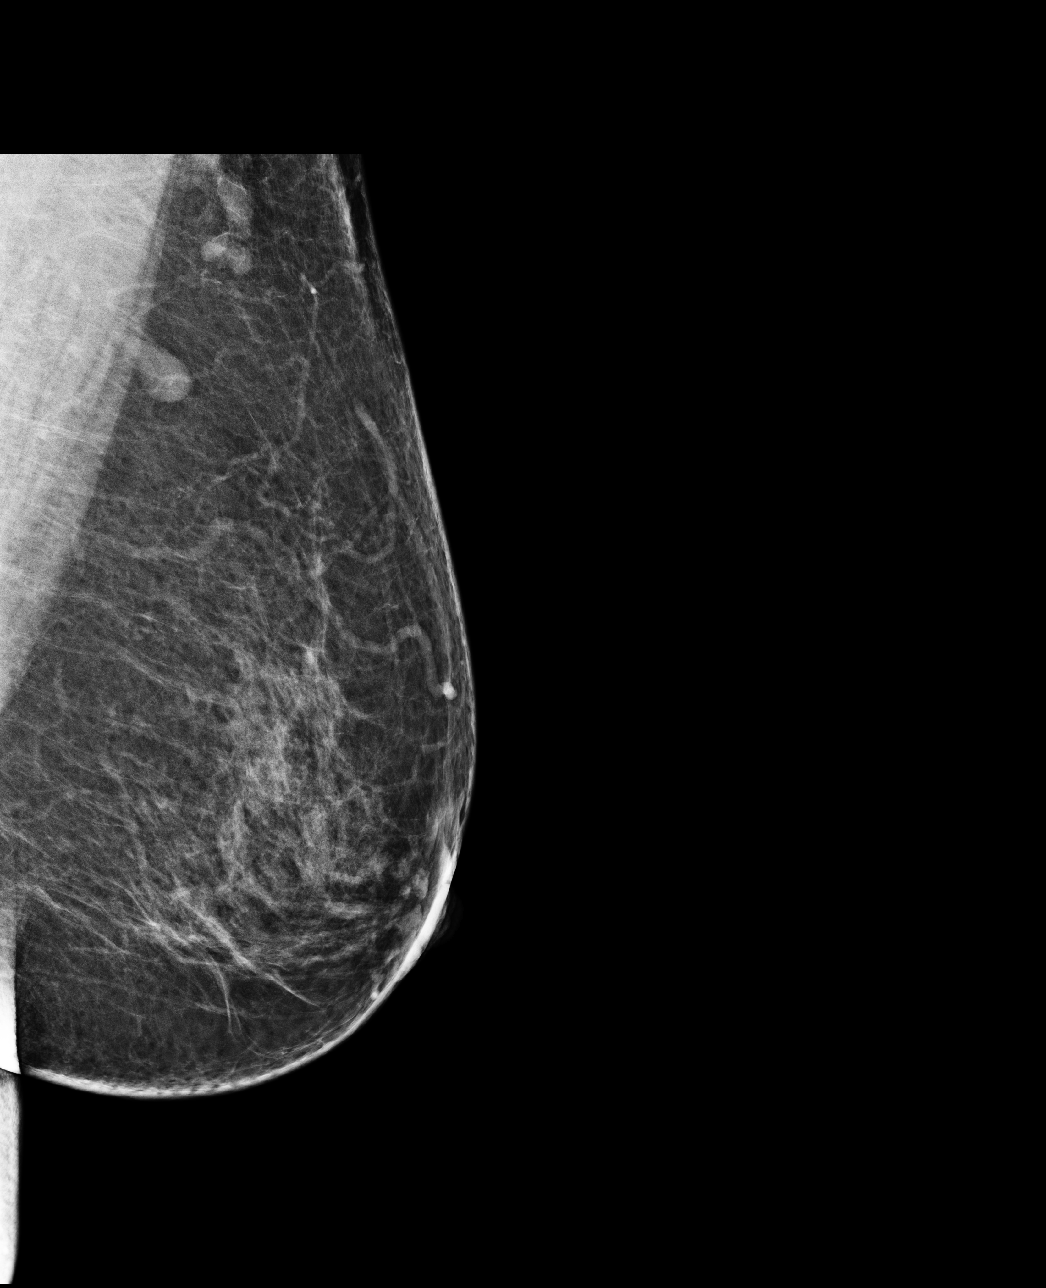

[R CC]
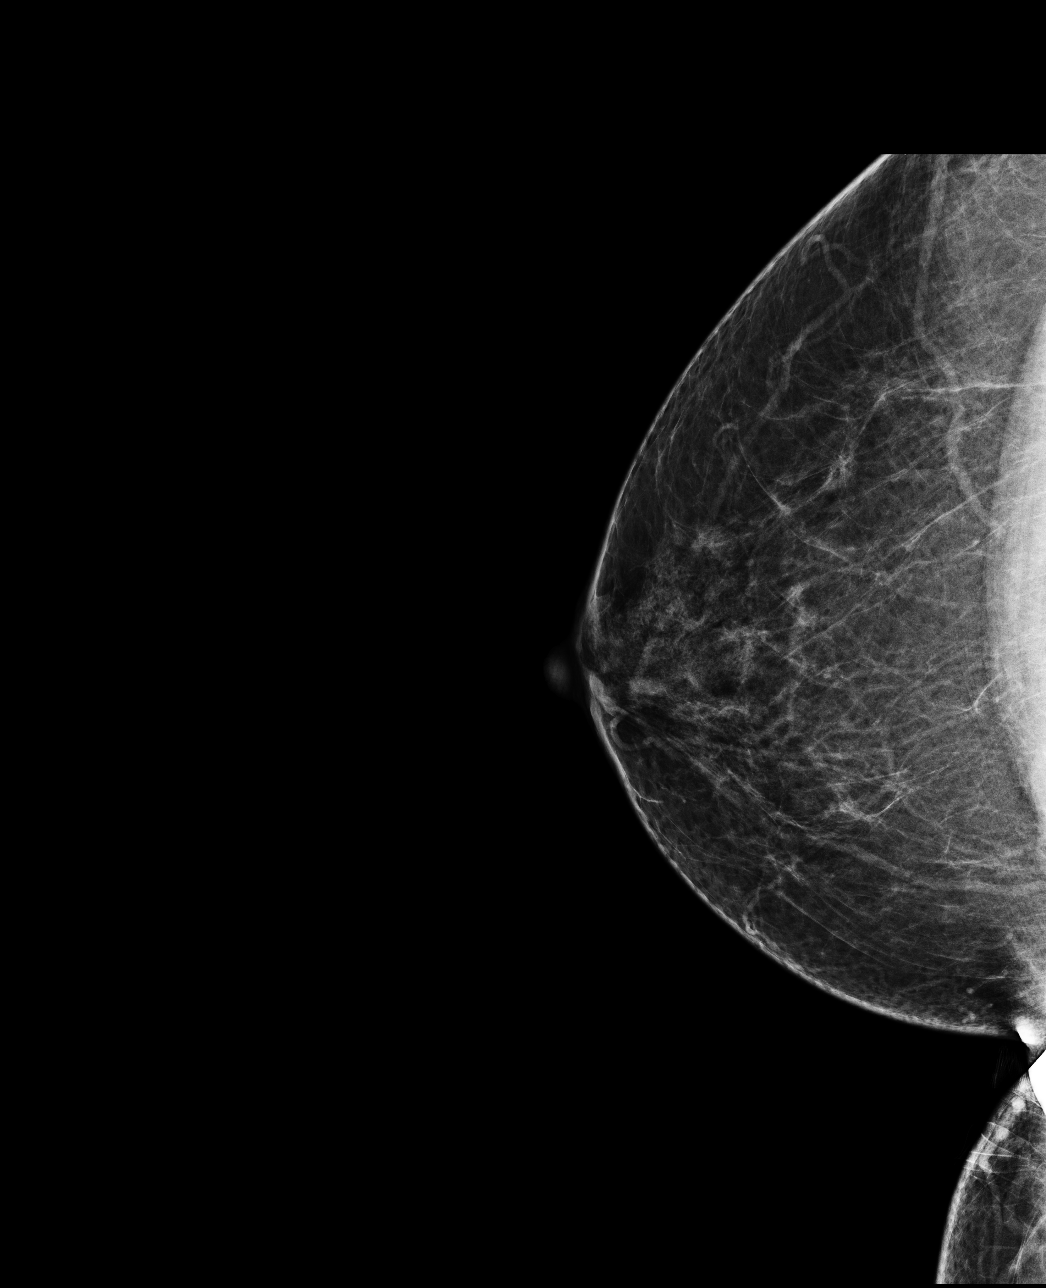

[R MLO]
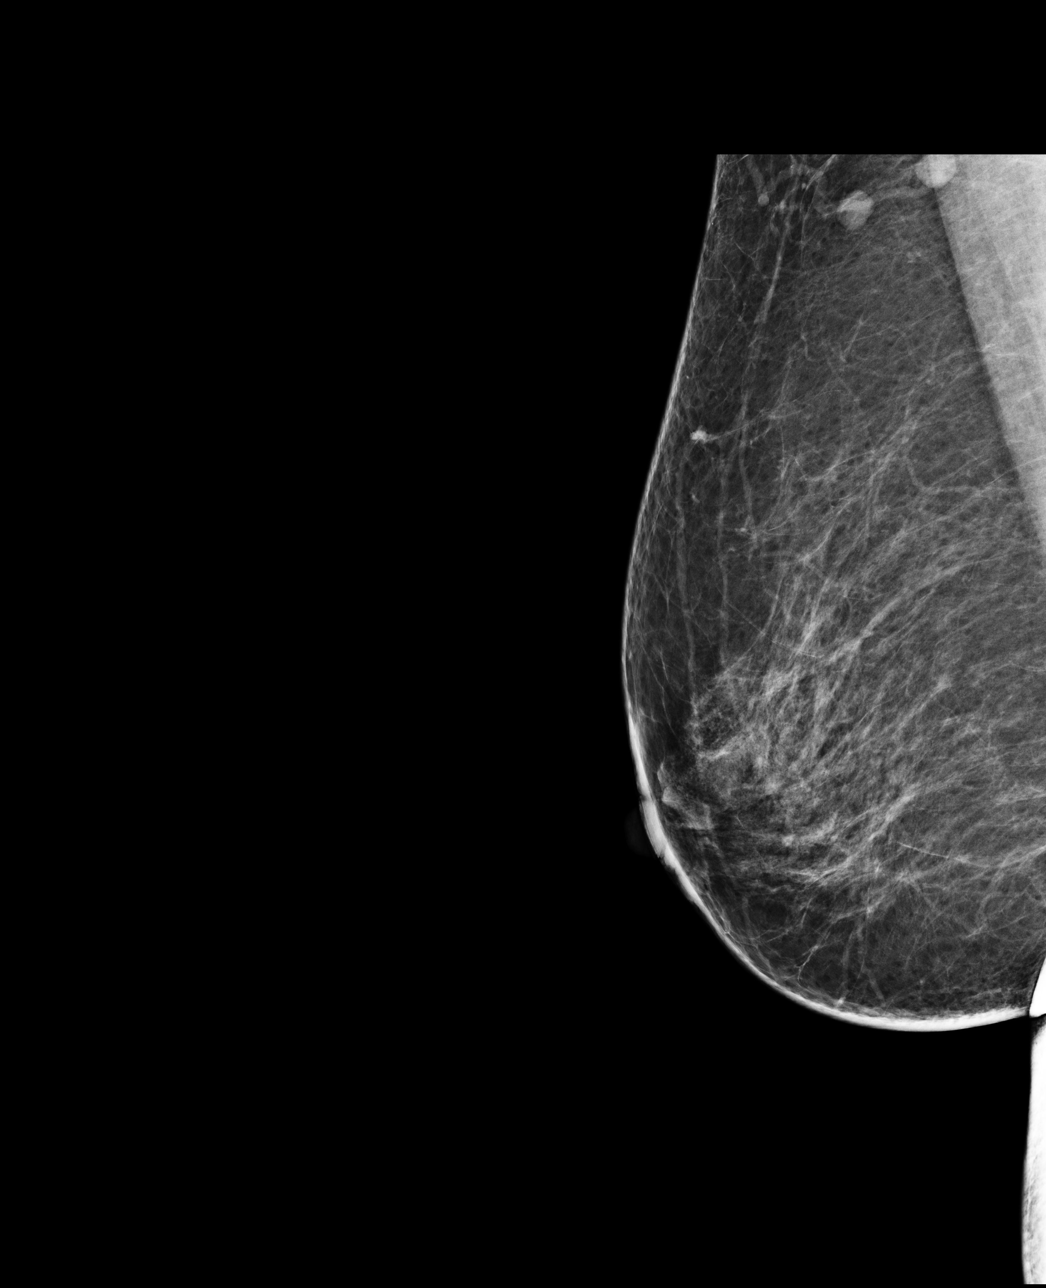

[4 of 4 positions shown; findings below may reference images not displayed]

ACR Breast Density Category b: There are scattered areas of
fibroglandular density.
FINDINGS: There are no findings suspicious for malignancy. Images were
processed with CAD.
IMPRESSION: No mammographic evidence of malignancy. A result letter of this
screening mammogram will be mailed directly to the patient.

RECOMMENDATION:
Screening mammogram in one year. (Code:AS-G-LCT)

BI-RADS CATEGORY  1: Negative.

## 2019-11-13 DIAGNOSIS — D649 Anemia, unspecified: Secondary | ICD-10-CM | POA: Diagnosis not present

## 2019-11-13 DIAGNOSIS — E7800 Pure hypercholesterolemia, unspecified: Secondary | ICD-10-CM | POA: Diagnosis not present

## 2019-11-13 DIAGNOSIS — Z0000 Encounter for general adult medical examination without abnormal findings: Secondary | ICD-10-CM | POA: Diagnosis not present

## 2020-01-22 ENCOUNTER — Other Ambulatory Visit: Payer: Self-pay | Admitting: Physician Assistant

## 2020-01-22 ENCOUNTER — Ambulatory Visit
Admission: RE | Admit: 2020-01-22 | Discharge: 2020-01-22 | Disposition: A | Discharge status: Home / Self Care | Payer: BC Managed Care – PPO | Source: Ambulatory Visit | Attending: Physician Assistant | Admitting: Physician Assistant

## 2020-01-22 DIAGNOSIS — M25561 Pain in right knee: Secondary | ICD-10-CM | POA: Diagnosis not present

## 2020-01-22 DIAGNOSIS — R52 Pain, unspecified: Secondary | ICD-10-CM

## 2020-01-22 DIAGNOSIS — M1711 Unilateral primary osteoarthritis, right knee: Secondary | ICD-10-CM | POA: Diagnosis not present

## 2020-01-22 DIAGNOSIS — M25461 Effusion, right knee: Secondary | ICD-10-CM | POA: Diagnosis not present

## 2020-05-29 DIAGNOSIS — M1711 Unilateral primary osteoarthritis, right knee: Secondary | ICD-10-CM | POA: Diagnosis not present

## 2020-09-30 DIAGNOSIS — M25561 Pain in right knee: Secondary | ICD-10-CM | POA: Diagnosis not present

## 2020-11-05 ENCOUNTER — Other Ambulatory Visit: Payer: Self-pay | Admitting: Family Medicine

## 2020-11-05 DIAGNOSIS — Z1231 Encounter for screening mammogram for malignant neoplasm of breast: Secondary | ICD-10-CM

## 2020-11-18 DIAGNOSIS — D649 Anemia, unspecified: Secondary | ICD-10-CM | POA: Diagnosis not present

## 2020-11-18 DIAGNOSIS — E7800 Pure hypercholesterolemia, unspecified: Secondary | ICD-10-CM | POA: Diagnosis not present

## 2020-11-18 DIAGNOSIS — Z0000 Encounter for general adult medical examination without abnormal findings: Secondary | ICD-10-CM | POA: Diagnosis not present

## 2020-11-18 DIAGNOSIS — Z23 Encounter for immunization: Secondary | ICD-10-CM | POA: Diagnosis not present

## 2020-12-02 ENCOUNTER — Other Ambulatory Visit: Payer: Self-pay

## 2020-12-02 ENCOUNTER — Ambulatory Visit
Admission: RE | Admit: 2020-12-02 | Discharge: 2020-12-02 | Disposition: A | Discharge status: Home / Self Care | Payer: BC Managed Care – PPO | Source: Ambulatory Visit | Attending: Family Medicine | Admitting: Family Medicine

## 2020-12-02 DIAGNOSIS — Z1231 Encounter for screening mammogram for malignant neoplasm of breast: Secondary | ICD-10-CM | POA: Diagnosis not present

## 2021-02-05 DIAGNOSIS — M1711 Unilateral primary osteoarthritis, right knee: Secondary | ICD-10-CM | POA: Diagnosis not present

## 2021-04-24 DIAGNOSIS — M1711 Unilateral primary osteoarthritis, right knee: Secondary | ICD-10-CM | POA: Diagnosis not present

## 2021-05-01 DIAGNOSIS — M1711 Unilateral primary osteoarthritis, right knee: Secondary | ICD-10-CM | POA: Diagnosis not present

## 2021-05-01 DIAGNOSIS — M13861 Other specified arthritis, right knee: Secondary | ICD-10-CM | POA: Diagnosis not present

## 2021-05-08 DIAGNOSIS — M1711 Unilateral primary osteoarthritis, right knee: Secondary | ICD-10-CM | POA: Diagnosis not present

## 2021-06-12 DIAGNOSIS — M25561 Pain in right knee: Secondary | ICD-10-CM | POA: Diagnosis not present

## 2021-06-25 DIAGNOSIS — M25561 Pain in right knee: Secondary | ICD-10-CM | POA: Diagnosis not present

## 2021-07-02 DIAGNOSIS — M25561 Pain in right knee: Secondary | ICD-10-CM | POA: Diagnosis not present

## 2021-08-13 DIAGNOSIS — M25561 Pain in right knee: Secondary | ICD-10-CM | POA: Diagnosis not present

## 2021-10-16 DIAGNOSIS — M25561 Pain in right knee: Secondary | ICD-10-CM | POA: Diagnosis not present

## 2021-11-27 ENCOUNTER — Other Ambulatory Visit: Payer: Self-pay | Admitting: Family Medicine

## 2021-11-27 DIAGNOSIS — Z13820 Encounter for screening for osteoporosis: Secondary | ICD-10-CM | POA: Diagnosis not present

## 2021-11-27 DIAGNOSIS — Z0000 Encounter for general adult medical examination without abnormal findings: Secondary | ICD-10-CM | POA: Diagnosis not present

## 2021-11-27 DIAGNOSIS — E7800 Pure hypercholesterolemia, unspecified: Secondary | ICD-10-CM | POA: Diagnosis not present

## 2021-11-27 DIAGNOSIS — M1990 Unspecified osteoarthritis, unspecified site: Secondary | ICD-10-CM | POA: Diagnosis not present

## 2021-11-27 DIAGNOSIS — D649 Anemia, unspecified: Secondary | ICD-10-CM | POA: Diagnosis not present

## 2021-11-27 DIAGNOSIS — Z1239 Encounter for other screening for malignant neoplasm of breast: Secondary | ICD-10-CM | POA: Diagnosis not present

## 2021-11-30 ENCOUNTER — Other Ambulatory Visit: Payer: Self-pay | Admitting: Family Medicine

## 2021-11-30 DIAGNOSIS — Z1231 Encounter for screening mammogram for malignant neoplasm of breast: Secondary | ICD-10-CM

## 2021-12-02 DIAGNOSIS — M1711 Unilateral primary osteoarthritis, right knee: Secondary | ICD-10-CM | POA: Diagnosis not present

## 2021-12-04 ENCOUNTER — Ambulatory Visit: Payer: BC Managed Care – PPO

## 2022-01-28 ENCOUNTER — Ambulatory Visit
Admission: RE | Admit: 2022-01-28 | Discharge: 2022-01-28 | Disposition: A | Discharge status: Home / Self Care | Payer: Medicare Other | Source: Ambulatory Visit | Attending: Family Medicine | Admitting: Family Medicine

## 2022-01-28 DIAGNOSIS — Z1231 Encounter for screening mammogram for malignant neoplasm of breast: Secondary | ICD-10-CM

## 2022-02-17 DIAGNOSIS — M1711 Unilateral primary osteoarthritis, right knee: Secondary | ICD-10-CM | POA: Diagnosis not present

## 2022-03-05 DIAGNOSIS — M1711 Unilateral primary osteoarthritis, right knee: Secondary | ICD-10-CM | POA: Diagnosis not present

## 2022-04-03 DIAGNOSIS — E669 Obesity, unspecified: Secondary | ICD-10-CM | POA: Diagnosis not present

## 2022-04-03 DIAGNOSIS — M1990 Unspecified osteoarthritis, unspecified site: Secondary | ICD-10-CM | POA: Diagnosis not present

## 2022-04-03 DIAGNOSIS — Z87891 Personal history of nicotine dependence: Secondary | ICD-10-CM | POA: Diagnosis not present

## 2022-04-03 DIAGNOSIS — Z791 Long term (current) use of non-steroidal anti-inflammatories (NSAID): Secondary | ICD-10-CM | POA: Diagnosis not present

## 2022-04-03 DIAGNOSIS — Z6831 Body mass index (BMI) 31.0-31.9, adult: Secondary | ICD-10-CM | POA: Diagnosis not present

## 2022-04-09 DIAGNOSIS — D649 Anemia, unspecified: Secondary | ICD-10-CM | POA: Diagnosis not present

## 2022-04-09 DIAGNOSIS — E7800 Pure hypercholesterolemia, unspecified: Secondary | ICD-10-CM | POA: Diagnosis not present

## 2022-05-17 DIAGNOSIS — G8918 Other acute postprocedural pain: Secondary | ICD-10-CM | POA: Diagnosis not present

## 2022-05-17 DIAGNOSIS — Z96651 Presence of right artificial knee joint: Secondary | ICD-10-CM | POA: Diagnosis not present

## 2022-05-17 DIAGNOSIS — M1711 Unilateral primary osteoarthritis, right knee: Secondary | ICD-10-CM | POA: Diagnosis not present

## 2022-05-19 ENCOUNTER — Other Ambulatory Visit: Payer: BC Managed Care – PPO

## 2022-05-19 DIAGNOSIS — M25561 Pain in right knee: Secondary | ICD-10-CM | POA: Diagnosis not present

## 2022-05-21 DIAGNOSIS — M25561 Pain in right knee: Secondary | ICD-10-CM | POA: Diagnosis not present

## 2022-05-26 DIAGNOSIS — M25561 Pain in right knee: Secondary | ICD-10-CM | POA: Diagnosis not present

## 2022-05-28 DIAGNOSIS — M25561 Pain in right knee: Secondary | ICD-10-CM | POA: Diagnosis not present

## 2022-06-01 DIAGNOSIS — M25561 Pain in right knee: Secondary | ICD-10-CM | POA: Diagnosis not present

## 2022-06-04 DIAGNOSIS — M25561 Pain in right knee: Secondary | ICD-10-CM | POA: Diagnosis not present

## 2022-06-08 DIAGNOSIS — M25561 Pain in right knee: Secondary | ICD-10-CM | POA: Diagnosis not present

## 2022-06-11 ENCOUNTER — Ambulatory Visit
Admission: RE | Admit: 2022-06-11 | Discharge: 2022-06-11 | Disposition: A | Discharge status: Home / Self Care | Payer: Medicare HMO | Source: Ambulatory Visit | Attending: Family Medicine | Admitting: Family Medicine

## 2022-06-11 DIAGNOSIS — E349 Endocrine disorder, unspecified: Secondary | ICD-10-CM | POA: Diagnosis not present

## 2022-06-11 DIAGNOSIS — Z13820 Encounter for screening for osteoporosis: Secondary | ICD-10-CM

## 2022-06-11 DIAGNOSIS — Z90710 Acquired absence of both cervix and uterus: Secondary | ICD-10-CM | POA: Diagnosis not present

## 2022-06-11 DIAGNOSIS — M810 Age-related osteoporosis without current pathological fracture: Secondary | ICD-10-CM | POA: Diagnosis not present

## 2022-06-11 DIAGNOSIS — M25561 Pain in right knee: Secondary | ICD-10-CM | POA: Diagnosis not present

## 2022-06-15 DIAGNOSIS — M25561 Pain in right knee: Secondary | ICD-10-CM | POA: Diagnosis not present

## 2022-06-18 DIAGNOSIS — M25561 Pain in right knee: Secondary | ICD-10-CM | POA: Diagnosis not present

## 2022-06-22 DIAGNOSIS — M25561 Pain in right knee: Secondary | ICD-10-CM | POA: Diagnosis not present

## 2022-06-24 DIAGNOSIS — R030 Elevated blood-pressure reading, without diagnosis of hypertension: Secondary | ICD-10-CM | POA: Diagnosis not present

## 2022-06-24 DIAGNOSIS — D649 Anemia, unspecified: Secondary | ICD-10-CM | POA: Diagnosis not present

## 2022-06-24 DIAGNOSIS — M1990 Unspecified osteoarthritis, unspecified site: Secondary | ICD-10-CM | POA: Diagnosis not present

## 2022-06-24 DIAGNOSIS — E669 Obesity, unspecified: Secondary | ICD-10-CM | POA: Diagnosis not present

## 2022-06-24 DIAGNOSIS — M25561 Pain in right knee: Secondary | ICD-10-CM | POA: Diagnosis not present

## 2022-06-24 DIAGNOSIS — Z0000 Encounter for general adult medical examination without abnormal findings: Secondary | ICD-10-CM | POA: Diagnosis not present

## 2022-06-24 DIAGNOSIS — R7303 Prediabetes: Secondary | ICD-10-CM | POA: Diagnosis not present

## 2022-06-24 DIAGNOSIS — E7800 Pure hypercholesterolemia, unspecified: Secondary | ICD-10-CM | POA: Diagnosis not present

## 2022-06-25 DIAGNOSIS — Z96651 Presence of right artificial knee joint: Secondary | ICD-10-CM | POA: Diagnosis not present

## 2022-07-29 DIAGNOSIS — D649 Anemia, unspecified: Secondary | ICD-10-CM | POA: Diagnosis not present

## 2022-08-25 DIAGNOSIS — D649 Anemia, unspecified: Secondary | ICD-10-CM | POA: Diagnosis not present

## 2022-12-24 DIAGNOSIS — M722 Plantar fascial fibromatosis: Secondary | ICD-10-CM | POA: Diagnosis not present

## 2022-12-30 DIAGNOSIS — R7303 Prediabetes: Secondary | ICD-10-CM | POA: Diagnosis not present

## 2022-12-30 DIAGNOSIS — E669 Obesity, unspecified: Secondary | ICD-10-CM | POA: Diagnosis not present

## 2022-12-30 DIAGNOSIS — M1990 Unspecified osteoarthritis, unspecified site: Secondary | ICD-10-CM | POA: Diagnosis not present

## 2022-12-30 DIAGNOSIS — Z0000 Encounter for general adult medical examination without abnormal findings: Secondary | ICD-10-CM | POA: Diagnosis not present

## 2022-12-30 DIAGNOSIS — E7800 Pure hypercholesterolemia, unspecified: Secondary | ICD-10-CM | POA: Diagnosis not present

## 2022-12-30 DIAGNOSIS — D649 Anemia, unspecified: Secondary | ICD-10-CM | POA: Diagnosis not present

## 2023-01-07 DIAGNOSIS — M1611 Unilateral primary osteoarthritis, right hip: Secondary | ICD-10-CM | POA: Diagnosis not present

## 2023-02-25 DIAGNOSIS — R030 Elevated blood-pressure reading, without diagnosis of hypertension: Secondary | ICD-10-CM | POA: Diagnosis not present

## 2023-02-25 DIAGNOSIS — Z809 Family history of malignant neoplasm, unspecified: Secondary | ICD-10-CM | POA: Diagnosis not present

## 2023-02-25 DIAGNOSIS — Z6834 Body mass index (BMI) 34.0-34.9, adult: Secondary | ICD-10-CM | POA: Diagnosis not present

## 2023-02-25 DIAGNOSIS — E669 Obesity, unspecified: Secondary | ICD-10-CM | POA: Diagnosis not present

## 2023-02-25 DIAGNOSIS — Z833 Family history of diabetes mellitus: Secondary | ICD-10-CM | POA: Diagnosis not present

## 2023-02-25 DIAGNOSIS — Z87891 Personal history of nicotine dependence: Secondary | ICD-10-CM | POA: Diagnosis not present

## 2023-02-25 DIAGNOSIS — Z8249 Family history of ischemic heart disease and other diseases of the circulatory system: Secondary | ICD-10-CM | POA: Diagnosis not present

## 2023-02-25 DIAGNOSIS — M1990 Unspecified osteoarthritis, unspecified site: Secondary | ICD-10-CM | POA: Diagnosis not present

## 2023-02-25 DIAGNOSIS — Z791 Long term (current) use of non-steroidal anti-inflammatories (NSAID): Secondary | ICD-10-CM | POA: Diagnosis not present

## 2023-04-28 DIAGNOSIS — Z96651 Presence of right artificial knee joint: Secondary | ICD-10-CM | POA: Diagnosis not present

## 2023-04-28 DIAGNOSIS — M65311 Trigger thumb, right thumb: Secondary | ICD-10-CM | POA: Diagnosis not present

## 2023-05-24 ENCOUNTER — Other Ambulatory Visit: Payer: Self-pay | Admitting: Family Medicine

## 2023-05-24 DIAGNOSIS — Z1231 Encounter for screening mammogram for malignant neoplasm of breast: Secondary | ICD-10-CM

## 2023-05-26 ENCOUNTER — Ambulatory Visit
Admission: RE | Admit: 2023-05-26 | Discharge: 2023-05-26 | Disposition: A | Discharge status: Home / Self Care | Source: Ambulatory Visit | Attending: Family Medicine | Admitting: Family Medicine

## 2023-05-26 DIAGNOSIS — Z1231 Encounter for screening mammogram for malignant neoplasm of breast: Secondary | ICD-10-CM | POA: Diagnosis not present

## 2023-06-30 DIAGNOSIS — Z1211 Encounter for screening for malignant neoplasm of colon: Secondary | ICD-10-CM | POA: Diagnosis not present

## 2023-06-30 DIAGNOSIS — D649 Anemia, unspecified: Secondary | ICD-10-CM | POA: Diagnosis not present

## 2023-06-30 DIAGNOSIS — R7303 Prediabetes: Secondary | ICD-10-CM | POA: Diagnosis not present

## 2023-06-30 DIAGNOSIS — E7800 Pure hypercholesterolemia, unspecified: Secondary | ICD-10-CM | POA: Diagnosis not present

## 2023-07-04 ENCOUNTER — Other Ambulatory Visit (HOSPITAL_BASED_OUTPATIENT_CLINIC_OR_DEPARTMENT_OTHER): Payer: Self-pay | Admitting: Family Medicine

## 2023-07-04 DIAGNOSIS — E7800 Pure hypercholesterolemia, unspecified: Secondary | ICD-10-CM

## 2023-07-07 DIAGNOSIS — Z1211 Encounter for screening for malignant neoplasm of colon: Secondary | ICD-10-CM | POA: Diagnosis not present

## 2023-07-21 ENCOUNTER — Ambulatory Visit (HOSPITAL_COMMUNITY)
Admission: RE | Admit: 2023-07-21 | Discharge: 2023-07-21 | Disposition: A | Discharge status: Home / Self Care | Payer: Self-pay | Source: Ambulatory Visit | Attending: Family Medicine | Admitting: Family Medicine

## 2023-07-21 DIAGNOSIS — E7800 Pure hypercholesterolemia, unspecified: Secondary | ICD-10-CM | POA: Insufficient documentation

## 2023-07-27 ENCOUNTER — Emergency Department (HOSPITAL_COMMUNITY)

## 2023-07-27 ENCOUNTER — Emergency Department (HOSPITAL_COMMUNITY)
Admission: EM | Admit: 2023-07-27 | Discharge: 2023-07-27 | Disposition: A | Discharge status: Home / Self Care | Attending: Emergency Medicine | Admitting: Emergency Medicine

## 2023-07-27 ENCOUNTER — Other Ambulatory Visit: Payer: Self-pay

## 2023-07-27 ENCOUNTER — Encounter (HOSPITAL_COMMUNITY): Payer: Self-pay

## 2023-07-27 DIAGNOSIS — J40 Bronchitis, not specified as acute or chronic: Secondary | ICD-10-CM | POA: Diagnosis not present

## 2023-07-27 DIAGNOSIS — R600 Localized edema: Secondary | ICD-10-CM | POA: Diagnosis not present

## 2023-07-27 DIAGNOSIS — R059 Cough, unspecified: Secondary | ICD-10-CM | POA: Diagnosis not present

## 2023-07-27 DIAGNOSIS — R0602 Shortness of breath: Secondary | ICD-10-CM | POA: Diagnosis not present

## 2023-07-27 DIAGNOSIS — Z743 Need for continuous supervision: Secondary | ICD-10-CM | POA: Diagnosis not present

## 2023-07-27 DIAGNOSIS — I10 Essential (primary) hypertension: Secondary | ICD-10-CM | POA: Diagnosis not present

## 2023-07-27 LAB — CBC WITH DIFFERENTIAL/PLATELET
Abs Immature Granulocytes: 0.01 K/uL (ref 0.00–0.07)
Basophils Absolute: 0 K/uL (ref 0.0–0.1)
Basophils Relative: 1 %
Eosinophils Absolute: 0.4 K/uL (ref 0.0–0.5)
Eosinophils Relative: 7 %
HCT: 35.7 % — ABNORMAL LOW (ref 36.0–46.0)
Hemoglobin: 11.7 g/dL — ABNORMAL LOW (ref 12.0–15.0)
Immature Granulocytes: 0 %
Lymphocytes Relative: 32 %
Lymphs Abs: 1.6 K/uL (ref 0.7–4.0)
MCH: 30 pg (ref 26.0–34.0)
MCHC: 32.8 g/dL (ref 30.0–36.0)
MCV: 91.5 fL (ref 80.0–100.0)
Monocytes Absolute: 0.3 K/uL (ref 0.1–1.0)
Monocytes Relative: 6 %
Neutro Abs: 2.8 K/uL (ref 1.7–7.7)
Neutrophils Relative %: 54 %
Platelets: 328 K/uL (ref 150–400)
RBC: 3.9 MIL/uL (ref 3.87–5.11)
RDW: 13.1 % (ref 11.5–15.5)
WBC: 5.1 K/uL (ref 4.0–10.5)
nRBC: 0 % (ref 0.0–0.2)

## 2023-07-27 LAB — RESP PANEL BY RT-PCR (RSV, FLU A&B, COVID)  RVPGX2
Influenza A by PCR: NEGATIVE
Influenza B by PCR: NEGATIVE
Resp Syncytial Virus by PCR: NEGATIVE
SARS Coronavirus 2 by RT PCR: NEGATIVE

## 2023-07-27 LAB — I-STAT CHEM 8, ED
BUN: 23 mg/dL (ref 8–23)
Calcium, Ion: 1.21 mmol/L (ref 1.15–1.40)
Chloride: 106 mmol/L (ref 98–111)
Creatinine, Ser: 0.9 mg/dL (ref 0.44–1.00)
Glucose, Bld: 93 mg/dL (ref 70–99)
HCT: 36 % (ref 36.0–46.0)
Hemoglobin: 12.2 g/dL (ref 12.0–15.0)
Potassium: 4 mmol/L (ref 3.5–5.1)
Sodium: 139 mmol/L (ref 135–145)
TCO2: 22 mmol/L (ref 22–32)

## 2023-07-27 MED ORDER — IPRATROPIUM-ALBUTEROL 0.5-2.5 (3) MG/3ML IN SOLN
3.0000 mL | Freq: Once | RESPIRATORY_TRACT | Status: AC
  Administered 2023-07-27: 3 mL via RESPIRATORY_TRACT
  Filled 2023-07-27: qty 3

## 2023-07-27 MED ORDER — PREDNISONE 20 MG PO TABS: 40.0000 mg | ORAL_TABLET | Freq: Every day | ORAL | 0 refills | Status: AC

## 2023-07-27 MED ORDER — IPRATROPIUM BROMIDE 0.02 % IN SOLN
0.5000 mg | Freq: Once | RESPIRATORY_TRACT | Status: AC
  Administered 2023-07-27: 0.5 mg via RESPIRATORY_TRACT
  Filled 2023-07-27: qty 2.5

## 2023-07-27 MED ORDER — ALBUTEROL SULFATE (2.5 MG/3ML) 0.083% IN NEBU
5.0000 mg | INHALATION_SOLUTION | Freq: Once | RESPIRATORY_TRACT | Status: AC
  Administered 2023-07-27: 5 mg via RESPIRATORY_TRACT
  Filled 2023-07-27: qty 6

## 2023-07-27 MED ORDER — PREDNISONE 20 MG PO TABS
60.0000 mg | ORAL_TABLET | Freq: Once | ORAL | Status: AC
  Administered 2023-07-27: 60 mg via ORAL
  Filled 2023-07-27: qty 3

## 2023-07-27 MED ORDER — ALBUTEROL SULFATE HFA 108 (90 BASE) MCG/ACT IN AERS
2.0000 | INHALATION_SPRAY | RESPIRATORY_TRACT | Status: DC | PRN
  Administered 2023-07-27: 2 via RESPIRATORY_TRACT
  Filled 2023-07-27: qty 6.7

## 2023-07-27 NOTE — ED Notes (Signed)
 Patient Alert and oriented to baseline. Stable and ambulatory to baseline. Patient verbalized understanding of the discharge instructions.  Patient belongings were taken by the patient.

## 2023-07-27 NOTE — ED Triage Notes (Signed)
 PT BIB [facility] from work after SOB w/ exertion. PT has had productive cough x3 days, endorses yellow mucus and taking mucinex and robitussin to treat at home. Aox4.    EMS VS:  Spo2 97% RA  BP 162/82,  HR 76 NSR, RR 18

## 2023-07-27 NOTE — Discharge Instructions (Addendum)
 It appears that you have bronchitis today.  The x-ray was clear without signs of pneumonia.  You can use the inhaler 2 puffs every 4-6 hours as needed for any chest tightness or wheezing.  You will take the next dose of prednisone  tomorrow.  It is okay to continue Mucinex if you feel like it helps with the congestion but make sure you are drinking plenty of fluid with it.  If in the meantime you start developing high fevers, the shortness of breath gets worse return to the emergency room.  All your lab work today was normal.

## 2023-07-27 NOTE — ED Provider Notes (Signed)
 [place] EMERGENCY DEPARTMENT AT [facility] Provider Note   CSN: [id number] Arrival date & time: 07/27/23  9057     Patient presents with: Shortness of Breath   [name] is a 68 y.o. female.   Patient is a healthy 68 year old female with no known medical problems who is presenting today due to URI symptoms for the last 2 days and then worsening shortness of breath today.  She reports that for 2 to 3 days now she has had cough, nasal congestion, occasional sputum production and felt like she just had a really bad cold.  However an hour after she got to work today she became very short of breath and felt like she could not catch her breath.  She reports the whole time her oxygen saturation was fine but she just felt like she could not get enough air in.  She did not feel that oxygen really helped.  She had been taking Mucinex and Robitussin at home to try to treat her symptoms.  She is not aware of any particular sick contact.  She denies any new leg swelling, abdominal pain, nausea or vomiting.  She has still been eating and denies any known fever.  She denies a history of any lung issues such as bronchitis, asthma.  Patient does not use tobacco products and is never needed an inhaler.  The history is provided by the patient and medical records.  Shortness of Breath      Prior to Admission medications   Medication Sig Start Date End Date Taking? Authorizing Provider  predniSONE  (DELTASONE ) 20 MG tablet Take 2 tablets (40 mg total) by mouth daily. 07/27/23  Yes Doretha Folks, MD    Allergies: Patient has no known allergies.    Review of Systems  Respiratory:  Positive for shortness of breath.     Updated Vital Signs BP (!) 157/91   Pulse 80   Temp 97.9 F (36.6 C) (Oral)   Resp 15   Ht 5' 4 (1.626 m)   Wt 86.2 kg   SpO2 97%   BMI 32.61 kg/m   Physical Exam Vitals and nursing note reviewed.  Constitutional:      General: She is not in acute  distress.    Appearance: She is well-developed.  HENT:     Head: Normocephalic and atraumatic.     Nose: Congestion present.  Eyes:     Pupils: Pupils are equal, round, and reactive to light.  Cardiovascular:     Rate and Rhythm: Normal rate and regular rhythm.     Pulses: Normal pulses.     Heart sounds: Normal heart sounds. No murmur heard.    No friction rub.  Pulmonary:     Effort: Pulmonary effort is normal. Tachypnea present.     Breath sounds: Decreased breath sounds and wheezing present. No rales.  Abdominal:     General: Bowel sounds are normal. There is no distension.     Palpations: Abdomen is soft.     Tenderness: There is no abdominal tenderness. There is no guarding or rebound.  Musculoskeletal:        General: No tenderness. Normal range of motion.     Comments: Trace edema in the right ankle which patient reports is chronic  Skin:    General: Skin is warm and dry.     Findings: No rash.  Neurological:     Mental Status: She is alert and oriented to person, place, and time.  Cranial Nerves: No cranial nerve deficit.  Psychiatric:        Behavior: Behavior normal.     (all labs ordered are listed, but only abnormal results are displayed) Labs Reviewed  CBC WITH DIFFERENTIAL/PLATELET - Abnormal; Notable for the following components:      Result Value   Hemoglobin 11.7 (*)    HCT 35.7 (*)    All other components within normal limits  RESP PANEL BY RT-PCR (RSV, FLU A&B, COVID)  RVPGX2  I-STAT CHEM 8, ED    EKG: None  Radiology: DG Chest Port 1 View Result Date: 07/27/2023 CLINICAL DATA:  Shortness of breath EXAM: PORTABLE CHEST 1 VIEW COMPARISON:  None Available. FINDINGS: No focal consolidation, pleural effusion or pneumothorax. Top-normal cardiac size. No acute osseous pathology. IMPRESSION: No active disease. Electronically Signed   By: Vanetta Chou M.D.   On: 07/27/2023 10:33     Procedures   Medications Ordered in the ED  albuterol   (VENTOLIN  HFA) 108 (90 Base) MCG/ACT inhaler 2 puff (2 puffs Inhalation Given 07/27/23 1335)  albuterol  (PROVENTIL ) (2.5 MG/3ML) 0.083% nebulizer solution 5 mg (5 mg Nebulization Given 07/27/23 1025)  ipratropium (ATROVENT ) nebulizer solution 0.5 mg (0.5 mg Nebulization Given 07/27/23 1025)  predniSONE  (DELTASONE ) tablet 60 mg (60 mg Oral Given 07/27/23 1024)  ipratropium-albuterol  (DUONEB) 0.5-2.5 (3) MG/3ML nebulizer solution 3 mL (3 mLs Nebulization Given 07/27/23 1231)                                    Medical Decision Making Amount and/or Complexity of Data Reviewed Labs: ordered. Decision-making details documented in ED Course. Radiology: ordered and independent interpretation performed. Decision-making details documented in ED Course.  Risk Prescription drug management.   Pt with multiple medical problems and comorbidities and presenting today with a complaint that caries a high risk for morbidity and mortality.  Here today with complaint of shortness of breath with recent URI, significant wheezing on exam.  Sats are normal at this time and patient is able to speak in full sentences but has audible wheezing.  Low suspicion for CHF, PE.  Concern for bronchitis versus pneumonia.  Patient given albuterol  and Atrovent  as well as prednisone .  Suspect viral cause.  3:32 PM I have independently visualized and interpreted pt's images today.  CXR wnl.  I independently interpreted patient's labs and CBC, Chem-8 are within normal limits.  Viral panel negative On repeat evaluation patient reports feeling better after initial DuoNeb.  Still having expiratory wheezing but improved. Wheezing improved after second DuoNeb. Patient discharged home with steroids and inhaler.  Questions were answered.  She and her family are comfortable with this plan but were given return precautions.       Final diagnoses:  Bronchitis    ED Discharge Orders          Ordered    predniSONE  (DELTASONE ) 20 MG tablet   Daily        07/27/23 1326               Doretha Folks, MD 07/27/23 1533

## 2023-10-13 DIAGNOSIS — E7800 Pure hypercholesterolemia, unspecified: Secondary | ICD-10-CM | POA: Diagnosis not present

## 2023-11-14 DIAGNOSIS — N951 Menopausal and female climacteric states: Secondary | ICD-10-CM | POA: Diagnosis not present

## 2023-11-14 DIAGNOSIS — Z90710 Acquired absence of both cervix and uterus: Secondary | ICD-10-CM | POA: Diagnosis not present

## 2023-11-14 DIAGNOSIS — R7303 Prediabetes: Secondary | ICD-10-CM | POA: Diagnosis not present

## 2023-11-14 DIAGNOSIS — Z23 Encounter for immunization: Secondary | ICD-10-CM | POA: Diagnosis not present

## 2023-11-14 DIAGNOSIS — E7800 Pure hypercholesterolemia, unspecified: Secondary | ICD-10-CM | POA: Diagnosis not present
